# Patient Record
Sex: Male | Born: 1940
Health system: Southern US, Community
[De-identification: ages and names within clinical notes are randomized; demographics above are authoritative.]

## PROBLEM LIST (undated history)

## (undated) DIAGNOSIS — J439 Emphysema, unspecified: Secondary | ICD-10-CM

## (undated) DIAGNOSIS — K219 Gastro-esophageal reflux disease without esophagitis: Secondary | ICD-10-CM

## (undated) DIAGNOSIS — E785 Hyperlipidemia, unspecified: Secondary | ICD-10-CM

## (undated) DIAGNOSIS — J449 Chronic obstructive pulmonary disease, unspecified: Secondary | ICD-10-CM

## (undated) DIAGNOSIS — I10 Essential (primary) hypertension: Secondary | ICD-10-CM

## (undated) DIAGNOSIS — Z860100 Personal history of colon polyps, unspecified: Secondary | ICD-10-CM

## (undated) DIAGNOSIS — K5790 Diverticulosis of intestine, part unspecified, without perforation or abscess without bleeding: Secondary | ICD-10-CM

## (undated) DIAGNOSIS — I2510 Atherosclerotic heart disease of native coronary artery without angina pectoris: Secondary | ICD-10-CM

## (undated) DIAGNOSIS — N189 Chronic kidney disease, unspecified: Secondary | ICD-10-CM

## (undated) DIAGNOSIS — R42 Dizziness and giddiness: Secondary | ICD-10-CM

## (undated) HISTORY — PX: COLONOSCOPY: SHX174

## (undated) HISTORY — DX: Hyperlipidemia, unspecified: E78.5

## (undated) HISTORY — DX: Gastro-esophageal reflux disease without esophagitis: K21.9

## (undated) HISTORY — PX: POLYPECTOMY: SHX149

## (undated) HISTORY — DX: Emphysema, unspecified: J43.9

## (undated) HISTORY — DX: Diverticulosis of intestine, part unspecified, without perforation or abscess without bleeding: K57.90

## (undated) HISTORY — DX: Personal history of colon polyps, unspecified: Z86.0100

## (undated) HISTORY — PX: CATARACT EXTRACTION: SUR2

## (undated) HISTORY — PX: EYE SURGERY: SHX253

## (undated) HISTORY — DX: Dizziness and giddiness: R42

## (undated) HISTORY — DX: Personal history of colonic polyps: Z86.010

## (undated) HISTORY — DX: Essential (primary) hypertension: I10

---

## 2008-11-19 ENCOUNTER — Emergency Department (HOSPITAL_COMMUNITY): Admission: EM | Admit: 2008-11-19 | Discharge: 2008-11-19 | Payer: Self-pay | Admitting: Emergency Medicine

## 2009-03-29 ENCOUNTER — Encounter (HOSPITAL_BASED_OUTPATIENT_CLINIC_OR_DEPARTMENT_OTHER): Admission: RE | Admit: 2009-03-29 | Discharge: 2009-06-24 | Payer: Self-pay | Admitting: General Surgery

## 2009-04-01 ENCOUNTER — Ambulatory Visit: Payer: Self-pay | Admitting: Vascular Surgery

## 2009-04-01 ENCOUNTER — Encounter (HOSPITAL_BASED_OUTPATIENT_CLINIC_OR_DEPARTMENT_OTHER): Payer: Self-pay | Admitting: General Surgery

## 2009-04-01 ENCOUNTER — Ambulatory Visit: Admission: RE | Admit: 2009-04-01 | Discharge: 2009-04-01 | Payer: Self-pay | Admitting: General Surgery

## 2009-07-27 ENCOUNTER — Encounter (INDEPENDENT_AMBULATORY_CARE_PROVIDER_SITE_OTHER): Payer: Self-pay | Admitting: *Deleted

## 2009-08-18 ENCOUNTER — Encounter (INDEPENDENT_AMBULATORY_CARE_PROVIDER_SITE_OTHER): Payer: Self-pay | Admitting: *Deleted

## 2009-08-19 ENCOUNTER — Ambulatory Visit: Payer: Self-pay | Admitting: Internal Medicine

## 2009-09-02 ENCOUNTER — Ambulatory Visit: Payer: Self-pay | Admitting: Internal Medicine

## 2010-07-13 ENCOUNTER — Encounter: Payer: Self-pay | Admitting: Cardiology

## 2010-08-02 NOTE — Procedures (Signed)
Summary: Colonoscopy  Patient: [name] Note: All result statuses are Final unless otherwise noted.  Tests: (1) Colonoscopy (COL)   COL Colonoscopy           DONE     [facility]     [address]     [place], [place]  [address]           COLONOSCOPY PROCEDURE REPORT           PATIENT:  Michael, [name]  MR#:  [id number]     BIRTHDATE:  1941/03/12, 68 yrs. old  GENDER:  male           ENDOSCOPIST:  Wilhemina Bonito. Eda Keys, MD     Referred by:  Jarome Matin, M.D.           PROCEDURE DATE:  09/02/2009     PROCEDURE:  Colonoscopy with snare polypectomy x 1     ASA CLASS:  Class II     INDICATIONS:  history of pre-cancerous (adenomatous) colon polyps     (10-2005 in [place] w/ TV adenoma and adenoma)           MEDICATIONS:   Fentanyl 75 mcg IV, Versed 9 mg IV, Benadryl 25 mg     IV           DESCRIPTION OF PROCEDURE:   After the risks benefits and     alternatives of the procedure were thoroughly explained, informed     consent was obtained.  Digital rectal exam was performed and     revealed no abnormalities.   The LB CF-[id number] endoscope     was introduced through the anus and advanced to the cecum, which     was identified by both the appendix and ileocecal valve, without     limitations.Time to cecum = 1:18 min. The quality of the prep was     excellent, using MoviPrep.  The instrument was then slowly     withdrawn (time = 11:30 min) as the colon was fully examined.     <<PROCEDUREIMAGES>>           FINDINGS:  A 1mm diminutive polyp was found in the descending     colon. Polyp was snared without cautery. Retrieval was     unsuccessful as the tissue was too small for submission.  Mild     diverticulosis was found in the sigmoid colon.  This was otherwise     a normal examination of the colon.   Retroflexed views in the     rectum revealed no abnormalities.    The scope was then withdrawn     from the patient and the procedure completed.           COMPLICATIONS:   None           ENDOSCOPIC IMPRESSION:     1) Diminutive polyp in the descending colon - removed     2) Mild diverticulosis in the sigmoid colon     3) Otherwise normal examination           RECOMMENDATIONS:     1) Follow up colonoscopy in 5 years           ______________________________     Wilhemina Bonito. Eda Keys, MD           CC:  Jarome Matin, MD; The Patient           n.     eSIGNED:   Wilhemina Bonito. Marina Goodell  Jr at 09/02/2009 09:21 AM           Roseanne Reno, [id number]  Note: An exclamation mark (!) indicates a result that was not dispersed into the flowsheet. Document Creation Date: 09/02/2009 9:21 AM _______________________________________________________________________  (1) Order result status: Final Collection or observation date-time: 09/02/2009 09:15 Requested date-time:  Receipt date-time:  Reported date-time:  Referring Physician:   Ordering Physician: Fransico Setters [phone]) Specimen Source:  Source: Launa Grill Order Number: [id number] Lab site:   Appended Document: Colonoscopy recall     Procedures Next Due Date:    Colonoscopy: 09/2014

## 2010-08-02 NOTE — Letter (Signed)
Summary: [facility] Instructions  [facility]  [address] [place], [place] [address]   Phone: [phone]  Fax: [phone]       Michael [name]    [birth year]    MRN: [id number]        Procedure Day Michael [name]:  Michael [name]  09/02/09     Arrival Time:  7:30AM     Procedure Time:  8:30AM     Location of Procedure:                    Michael [name] _  [facility] (4th Floor)                        PREPARATION FOR COLONOSCOPY WITH MOVIPREP   Starting 5 days prior to your procedure 2/26/11do not eat nuts, seeds, popcorn, corn, beans, peas,  salads, or any raw vegetables.  Do not take any fiber supplements (e.g. Metamucil, Citrucel, and Benefiber).  THE DAY BEFORE YOUR PROCEDURE         DATE: 09/01/09  DAY: WEDNESDAY  1.  Drink clear liquids the entire day-NO SOLID FOOD  2.  Do not drink anything colored red or purple.  Avoid juices with pulp.  No orange juice.  3.  Drink at least 64 oz. (8 glasses) of fluid/clear liquids during the day to prevent dehydration and help the prep work efficiently.  CLEAR LIQUIDS INCLUDE: Water Jello Ice Popsicles Tea (sugar ok, no milk/cream) Powdered fruit flavored drinks Coffee (sugar ok, no milk/cream) Gatorade Juice: apple, white grape, white cranberry  Lemonade Clear bullion, consomm, broth Carbonated beverages (any kind) Strained chicken noodle soup Hard Candy                             4.  In the morning, mix first dose of MoviPrep solution:    Empty 1 Pouch A and 1 Pouch B into the disposable container    Add lukewarm drinking water to the top line of the container. Mix to dissolve    Refrigerate (mixed solution should be used within 24 hrs)  5.  Begin drinking the prep at 5:00 p.m. The MoviPrep container is divided by 4 marks.   Every 15 minutes drink the solution down to the next mark (approximately 8 oz) until the full liter is complete.   6.  Follow completed prep with 16 oz of clear liquid of your choice  (Nothing red or purple).  Continue to drink clear liquids until bedtime.  7.  Before going to bed, mix second dose of MoviPrep solution:    Empty 1 Pouch A and 1 Pouch B into the disposable container    Add lukewarm drinking water to the top line of the container. Mix to dissolve    Refrigerate  THE DAY OF YOUR PROCEDURE      DATE: 09/02/09  DAY: THURSDAY  Beginning at 3:30AM (5 hours before procedure):         1. Every 15 minutes, drink the solution down to the next mark (approx 8 oz) until the full liter is complete.  2. Follow completed prep with 16 oz. of clear liquid of your choice.    3. You may drink clear liquids until 6:30AM (2 HOURS BEFORE PROCEDURE).   MEDICATION INSTRUCTIONS  Unless otherwise instructed, you should take regular prescription medications with a small sip of water   as early as possible the morning of  your procedure.        OTHER INSTRUCTIONS  You will need a responsible adult at least 68 years of age to accompany you and drive you home.   This person must remain in the waiting room during your procedure.  Wear loose fitting clothing that is easily removed.  Leave jewelry and other valuables at home.  However, you may wish to bring a book to read or  an iPod/MP3 player to listen to music as you wait for your procedure to start.  Remove all body piercing jewelry and leave at home.  Total time from sign-in until discharge is approximately 2-3 hours.  You should go home directly after your procedure and rest.  You can resume normal activities the  day after your procedure.  The day of your procedure you should not:   Drive   Make legal decisions   Operate machinery   Drink alcohol   Return to work  You will receive specific instructions about eating, activities and medications before you leave.    The above instructions have been reviewed and explained to me by   Michael Almas RN  August 19, 2009 8:48 AM     I fully  understand and can verbalize these instructions _____________________________ Date _________

## 2010-08-02 NOTE — Letter (Signed)
Summary: Previsit letter  [facility]  [address] [place], [place] [address]   Phone: [phone]  Fax: [phone]       07/27/2009 MRN: [id number]  Michael [name] [address] [place], [place]  [address]  Dear Mr. Schneeberger,  Welcome to the Gastroenterology Division at [facility].    You are scheduled to see a nurse for your pre-procedure visit on 08-19-09 at 8:30a.m. on the 3rd floor at [facility], [address]  We ask that you try to arrive at our office 15 minutes prior to your appointment time to allow for check-in.  Your nurse visit will consist of discussing your medical and surgical history, your immediate family medical history, and your medications.    Please bring a complete list of all your medications or, if you prefer, bring the medication bottles and we will list them.  We will need to be aware of both prescribed and over the counter drugs.  We will need to know exact dosage information as well.  If you are on blood thinners (Coumadin, Plavix, Aggrenox, Ticlid, etc.) please call our office today/prior to your appointment, as we need to consult with your physician about holding your medication.   Please be prepared to read and sign documents such as consent forms, a financial agreement, and acknowledgement forms.  If necessary, and with your consent, a friend or relative is welcome to sit-in on the nurse visit with you.  Please bring your insurance card so that we may make a copy of it.  If your insurance requires a referral to see a specialist, please bring your referral form from your primary care physician.  No co-pay is required for this nurse visit.     If you cannot keep your appointment, please call [phone] to cancel or reschedule prior to your appointment date.  This allows [place] the opportunity to schedule an appointment for another patient in need of care.    Thank you for choosing [facility] for your medical  needs.  We appreciate the opportunity to care for you.  Please visit [place] at our website  to learn more about our practice.                     Sincerely.                                                                                                                   The Gastroenterology Division

## 2010-08-02 NOTE — Miscellaneous (Signed)
Summary: [facility] Previsit/prep  Clinical Lists Changes  Medications: Added new medication of MOVIPREP 100 GM  SOLR (PEG-KCL-NACL-NASULF-NA ASC-C) As per prep instructions. - Signed Rx of MOVIPREP 100 GM  SOLR (PEG-KCL-NACL-NASULF-NA ASC-C) As per prep instructions.;  #1 x 0;  Signed;  Entered by: Wyona Almas RN;  Authorized by: Hilarie Fredrickson MD;  Method used: Electronically to [id number]*, [address] [place], [place], [place]  [address], Ph: [phone], Fax: [phone] Observations: Added new observation of NKA: T (08/19/2009 8:17)    Prescriptions: MOVIPREP 100 GM  SOLR (PEG-KCL-NACL-NASULF-NA ASC-C) As per prep instructions.  #1 x 0   Entered by:   Wyona Almas RN   Authorized by:   Hilarie Fredrickson MD   Signed by:   Wyona Almas RN on 08/19/2009   Method used:   Electronically to        [facility] [phone]* (retail)       [address] [place], [place]  [address]       Ph: [phone]       Fax: [phone]   RxID:   [id number]

## 2010-08-25 ENCOUNTER — Ambulatory Visit (INDEPENDENT_AMBULATORY_CARE_PROVIDER_SITE_OTHER): Payer: Medicare Other | Admitting: Cardiology

## 2010-08-25 ENCOUNTER — Encounter: Payer: Self-pay | Admitting: Cardiology

## 2010-08-25 DIAGNOSIS — I10 Essential (primary) hypertension: Secondary | ICD-10-CM | POA: Insufficient documentation

## 2010-08-25 DIAGNOSIS — E785 Hyperlipidemia, unspecified: Secondary | ICD-10-CM | POA: Insufficient documentation

## 2010-08-25 DIAGNOSIS — R42 Dizziness and giddiness: Secondary | ICD-10-CM | POA: Insufficient documentation

## 2010-08-25 DIAGNOSIS — R079 Chest pain, unspecified: Secondary | ICD-10-CM | POA: Insufficient documentation

## 2010-08-30 NOTE — Assessment & Plan Note (Signed)
Summary: NP6/DIZZINESS/PER Malachi Bonds [phone] PT HAVE  MEDICARE/BCBS-MB/A...   Primary Provider:  Dr. Eloise Harman   History of Present Illness: 70 yo with history of HTN, hyperlipidemia, and spells of lightheadedness presents for evaluation of recurrent lightheadedness.  Patient was evaluated for lightheadedness about 1.5 years ago by Dr. Donnie Aho.  He wore a 3-week event monitor and had an echo done, both were unremarkable.  Of note, his lightheaded symptoms had resolved by the time he had the monitor done.  He had no dizziness for a year or so, then 2-3 months ago, he developed the episodes again.  The episodes always occurred while standing up and lasted for a few minutes at a time.  No tachypalpitations with episodes.  No particular trigger.  He occasionally feels his heart skip a beat at night but this is not associated with dizziness.  He had about 5 episodes of lightheadedness but has not had any further episodes for over a month now.   Patient also reports episodes of shortness of breath and mild chest tightness with exertion.  He tends to note these episodes if he eats a meal before going on a walk (will get the chest tightness while walking) or when he tries to lift and carry a heavy object.  These symptoms are not frequent.  They always resolve with rest.  BP is elevated today in the office to 159/95.  He thinks that it runs lower at home.   Patient was not orthostatic in the office today.   Labs (11/11): LDL 144, HDL 68, creatinine 1.2  Current Medications (verified): 1)  Vitamin D (Ergocalciferol) 50000 Unit Caps (Ergocalciferol) .... Weekly 2)  Amlodipine Besylate 5 Mg Tabs (Amlodipine Besylate) .Marland Kitchen.. 1 By Mouth Daily 3)  Lisinopril 10 Mg Tabs (Lisinopril) .Marland Kitchen.. 1 By Mouth Daily 4)  Alprazolam 0.25 Mg Tabs (Alprazolam) .... As Needed 5)  Citracal Plus Bone Density  Tabs (Multiple Minerals-Vitamins) .Marland Kitchen.. 1 By Mouth Daily 6)  Aspirin 81 Mg  Tabs (Aspirin) .Marland Kitchen.. 1 By Mouth Daily 7)  Fish Oil    Oil (Fish Oil) .Marland Kitchen.. 1 By Mouth Daily 8)  Multivitamins   Tabs (Multiple Vitamin) .Marland Kitchen.. 1 By Mouth Daily 9)  Ipratropium Bromide 0.02 % Soln (Ipratropium Bromide) .... Uad  Allergies (verified): No Known Drug Allergies  Past History:  Past Medical History: 1. HTN 2. History of lightheadedness.  Workup for this about 1.5 years ago (Dr. Donnie Aho) included a 3 week event monitor and an echo, which were both unremarkable.  Of note, patient was no longer having lightheaded spells when he wore the monitor.  3.  Hyperlipidemia 4.  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  He does help out there fairly frequently.  Smoked about 1 ppd but quit in 1984.  Married.  Serves as Personal assistant.   Review of Systems       All systems reviewed and negative except as per HPI.   Vital Signs:  Patient profile:   70 year old male Height:      70 inches Weight:      198 pounds BMI:     28.51 Pulse rate:   63 / minute Pulse (ortho):   65 / minute Resp:     16 per minute BP sitting:   122 / 92  (right arm) BP standing:   159 / 95  Vitals Entered By: Marrion Coy, CNA (August 25, 2010 8:05 AM)  Serial Vital Signs/Assessments:  Time      Position  BP       Pulse  Resp  Temp     By           Lying RA  155/89   62                    Marrion Coy, CNA           Sitting   152/92   64                    Marrion Coy, CNA           Standing  159/95   65                    [address], CNA 2 Min     Standing  155/90   61                    [address] CNA 5 Min     Standing  153/97   62                    [place], CNA  Comments: Lying- No complaints Sitting- No complaints Standing- NO complaints By: Marrion Coy, CNA  2 Min 2 Min standing- No complaints By: Marrion Coy, CNA  5 Min 5 Min standing- no complaints By: Marrion Coy, CNA    Physical Exam  General:  Well developed,  well nourished, in no acute distress. Overweight.  Head:  normocephalic and atraumatic Nose:  no deformity, discharge, inflammation, or lesions Mouth:  Teeth, gums and palate normal. Oral mucosa normal. Neck:  Neck supple, no JVD. No masses, thyromegaly or abnormal cervical nodes. Lungs:  Clear bilaterally to auscultation and percussion. Heart:  Non-displaced PMI, chest non-tender; regular rate and rhythm, S1, S2 without murmurs, rubs or gallops. Carotid upstroke normal, no bruit.  Pedals normal pulses. No edema, no varicosities. Abdomen:  Bowel sounds positive; abdomen soft and non-tender without masses, organomegaly, or hernias noted. No hepatosplenomegaly. Extremities:  No clubbing or cyanosis. Neurologic:  Alert and oriented x 3. Skin:  Intact without lesions or rashes. Psych:  Normal affect.   Impression & Recommendations:  Problem # 1:  DIZZINESS (ICD-780.4) Patient has history of lightheaded spells, always while standing but with no other trigger.  He had an event monitor for the same symptoms 1.5 years ago that was unremarkable (but he did not have symptoms while the monitor was on).  He has not had any lightheaded spells now for almost 2 months.  As they seem to cluster in time, I will have him call us if the symptoms recur and I will have him put on a 3-week monitor at that time.  I am not going to put one on him now as I would like him to be having actual episodes when we monitor him. He had an echo 1.5 years ago that was unremarkable.   Problem # 2:  CHEST PAIN-UNSPECIFIED (ICD-786.50) Patient has some mild exertional chest pain and dyspnea.  He has a number of cardiac risk factors including HTN and hyperlipidemia.  I am going to set him up for an ETT-myoview for risk stratification.  He should continue ASA 81 mg daily.   Problem # 3:  HYPERTENSION, BENIGN (ICD-401.1) BP is high today.  Patient thinks it has been better at home.  I  will have him monitor BP daily for 2 weeks at  home.  My nurse will call him at that time, and if BP is running high on home reads, will increase lisinopril or amlodipine.   Problem # 4:  HYPERLIPIDEMIA-MIXED (ICD-272.4) LDL is high.  I am going to have the patient start pravastatin 20 mg daily given his risk profile.  Lipids/LFTs in 2 months.   Other Orders: Nuclear Stress Test (Nuc Stress Test)  Patient Instructions: 1)  Your physician has recommended you make the following change in your medication:  2)  Start Pravachol 20mg  daily in the evening. 3)  Your physician recommends that you return for a FASTING lipid profile/liver profile in 2 months 4)  Take and record your blood pressure--I will call you in 2 weeks to get the readings. Luana Shu  5)  Your physician has requested that you have an exercise stress myoview.  For further information please visit [email or web address]  Please follow instruction sheet, as given. 6)  Call our office if you have more dizziness/lightheadedness. Dr Shirlee Latch will order a 3 week  event monitor. 7)  Your physician wants you to follow-up in: 6 months with Dr Shirlee Latch.((AUGUST 2012)  You will receive a reminder letter in the mail two months in advance. If you don't receive a letter, please call our office to schedule the follow-up appointment. Prescriptions: PRAVACHOL 20 MG TABS (PRAVASTATIN SODIUM) one daily in the evening  #90 x 3   Entered by:   Katina Dung, RN, BSN   Authorized by:   Marca Ancona, MD   Signed by:   Katina Dung, RN, BSN on 08/25/2010   Method used:   Faxed to ...       [facility] (mail-order)             , [place]         Ph: [phone]       Fax: [phone]   RxID:   [id number]

## 2010-09-06 ENCOUNTER — Telehealth (INDEPENDENT_AMBULATORY_CARE_PROVIDER_SITE_OTHER): Payer: Self-pay | Admitting: *Deleted

## 2010-09-07 ENCOUNTER — Encounter: Payer: Self-pay | Admitting: Cardiology

## 2010-09-07 ENCOUNTER — Ambulatory Visit (HOSPITAL_COMMUNITY): Payer: Medicare Other | Attending: Cardiology

## 2010-09-07 DIAGNOSIS — R0609 Other forms of dyspnea: Secondary | ICD-10-CM

## 2010-09-07 DIAGNOSIS — R079 Chest pain, unspecified: Secondary | ICD-10-CM | POA: Insufficient documentation

## 2010-09-07 DIAGNOSIS — I491 Atrial premature depolarization: Secondary | ICD-10-CM

## 2010-09-07 DIAGNOSIS — R0789 Other chest pain: Secondary | ICD-10-CM

## 2010-09-08 ENCOUNTER — Telehealth: Payer: Self-pay | Admitting: Cardiology

## 2010-09-08 ENCOUNTER — Encounter: Payer: Self-pay | Admitting: Cardiology

## 2010-09-13 NOTE — Progress Notes (Signed)
Summary: Nuclear Pre-Procedure  Phone Note Outgoing Call Call back at [facility] Phone [phone]   Call placed by: Stanton Kidney, EMT-P,  September 06, 2010 11:33 AM Action Taken: Phone Call Completed Summary of Call: Reviewed information on Myoview Information Sheet (see scanned document for further details).  Spoke with the patient's wife. Stanton Kidney, EMT-P  September 06, 2010 11:34 AM      Nuclear Med Background Indications for Stress Test: Evaluation for Ischemia   History: Echo  History Comments:  ~2010 Echo: NL  Symptoms: Chest Pain, Chest Tightness with Exertion, Dizziness, Light-Headedness    Nuclear Pre-Procedure Cardiac Risk Factors: History of Smoking, Hypertension, Lipids Height (in): 70

## 2010-09-13 NOTE — Letter (Signed)
Summary: [facility] Office Visit Note   [facility] Office Visit Note   Imported By: Roderic Ovens 09/06/2010 15:27:58  _____________________________________________________________________  External Attachment:    Type:   Image     Comment:   External Document

## 2010-09-13 NOTE — Assessment & Plan Note (Signed)
Summary: Cardiology Nuclear Testing  Nuclear Med Background Indications for Stress Test: Evaluation for Ischemia   History: Echo, GXT  History Comments:  ~20 yrs ago GXT:OK per patient ~'10 Echo:normal  Symptoms: Chest Pain, Chest Pain with Exertion, Chest Tightness, Chest Tightness with Exertion, Dizziness, DOE, Light-Headedness, Palpitations  Symptoms Comments: Last episode of CP:one month.   Nuclear Pre-Procedure Cardiac Risk Factors: History of Smoking, Hypertension, Lipids Caffeine/Decaff Intake: None NPO After: 7:00 PM Lungs: Clear IV 0.9% NS with Angio Cath: 22g     IV Site: R Forearm IV Started by: Irean Hong, RN Chest Size (in) 44     Height (in): 70 Weight (lb): 192 BMI: 27.65  Nuclear Med Study 1 or 2 day study:  1 day     Stress Test Type:  Stress Reading MD:  Willa Rough, MD     Referring MD:  Marca Ancona, MD Resting Radionuclide:  Technetium 77m Tetrofosmin     Resting Radionuclide Dose:  11 mCi  Stress Radionuclide:  Technetium 66m Tetrofosmin     Stress Radionuclide Dose:  33 mCi   Stress Protocol Exercise Time (min):  9:30 min     Max HR:  146 bpm     Predicted Max HR:  153 bpm  Max Systolic BP: 198 mm Hg     Percent Max HR:  95.42 %     METS: 10.9 Rate Pressure Product:  91478    Stress Test Technologist:  Rea College, CMA-N     Nuclear Technologist:  Domenic Polite, CNMT  Rest Procedure  Myocardial perfusion imaging was performed at rest 45 minutes following the intravenous administration of Technetium 21m Tetrofosmin.  Stress Procedure  The patient exercised for 9:30 on the treadmill utilizing the Bruce protocol.  The patient stopped due to fatigue and denied any chest pain.  There were no diagnostic ST-T wave changes, only occasional PAC's.  Technetium 81m Tetrofosmin was injected at peak exercise and myocardial perfusion imaging was performed after a brief delay.  QPS Raw Data Images:  Normal; no motion artifact; normal heart/lung  ratio. Stress Images:  Normal homogeneous uptake in all areas of the myocardium. Rest Images:  Normal homogeneous uptake in all areas of the myocardium. Subtraction (SDS):  No evidence of ischemia. Transient Ischemic Dilatation:  .85  (Normal <1.22)  Lung/Heart Ratio:  .34  (Normal <0.45)  Quantitative Gated Spect Images QGS EDV:  75 ml QGS ESV:  27 ml QGS EF:  64 % QGS cine images:  Normal motion  Findings Normal nuclear study      Overall Impression  Exercise Capacity: Good exercise capacity. BP Response: Normal blood pressure response. Clinical Symptoms: SOB ECG Impression: No significant ST segment change suggestive of ischemia. Overall Impression: Normal stress nuclear study.  Appended Document: Cardiology Nuclear Testing good exercise capacity and normal stress test.   Appended Document: Cardiology Nuclear Testing discussed results with pt

## 2010-09-20 NOTE — Progress Notes (Signed)
Summary: BP readings--fax from pt  Phone Note Outgoing Call   Call placed by: Katina Dung, RN, BSN,  September 08, 2010 7:22 AM Call placed to: Patient Summary of Call: BP readings  Follow-up for Phone Call        pt to fax BP readings to me--I talked with pt --he will take one more reading tonight then put readings on spread sheet and fax to me tomorrow--he states he thinks BPs have been averaging in the 140/80 range--fax with BP readings 08/26/10-09/10/10  received from pt--will forward to Dr Shirlee Latch for review      Appended Document: BP readings--fax from pt received fax from pt with BP readings--Dr Shirlee Latch recommended pt increase Amlodipine to 7.5mg  daily --LMTCB   Appended Document: BP readings--fax from pt I discussed with pt--he will increase Amlodipine to 7.5mg  daily--he will monitor his BP for 2 weeks and fax results to Dr Shirlee Latch   Clinical Lists Changes  Medications: Changed medication from AMLODIPINE BESYLATE 5 MG TABS (AMLODIPINE BESYLATE) 1 by mouth daily to AMLODIPINE BESYLATE 5 MG TABS (AMLODIPINE BESYLATE) one and one-half daily Observations: Added new observation of MEDRECON: current updated (09/15/2010 17:51)       Current Medications (verified): 1)  Vitamin D (Ergocalciferol) 50000 Unit Caps (Ergocalciferol) .... Weekly 2)  Amlodipine Besylate 5 Mg Tabs (Amlodipine Besylate) .... One and One-Half Daily 3)  Lisinopril 10 Mg Tabs (Lisinopril) .Marland Kitchen.. 1 By Mouth Daily 4)  Alprazolam 0.25 Mg Tabs (Alprazolam) .... As Needed 5)  Citracal Plus Bone Density  Tabs (Multiple Minerals-Vitamins) .Marland Kitchen.. 1 By Mouth Daily 6)  Aspirin 81 Mg  Tabs (Aspirin) .Marland Kitchen.. 1 By Mouth Daily 7)  Fish Oil   Oil (Fish Oil) .Marland Kitchen.. 1 By Mouth Daily 8)  Multivitamins   Tabs (Multiple Vitamin) .Marland Kitchen.. 1 By Mouth Daily 9)  Ipratropium Bromide 0.02 % Soln (Ipratropium Bromide) .... Uad 10)  Pravachol 20 Mg Tabs (Pravastatin Sodium) .... One Daily in The Evening  Allergies: No Known Drug Allergies

## 2010-10-06 ENCOUNTER — Telehealth: Payer: Self-pay | Admitting: Cardiology

## 2010-10-06 NOTE — Telephone Encounter (Signed)
Pt needs refill of amlopidine at [facility] taking 1 and 1/2 tabs, said it was recently increased

## 2010-10-07 ENCOUNTER — Other Ambulatory Visit: Payer: Self-pay | Admitting: *Deleted

## 2010-10-07 MED ORDER — AMLODIPINE BESYLATE 5 MG PO TABS: 7.5000 mg | ORAL_TABLET | Freq: Every day | ORAL | Status: DC

## 2010-10-11 LAB — COMPREHENSIVE METABOLIC PANEL
ALT: 19 U/L (ref 0–53)
AST: 25 U/L (ref 0–37)
Albumin: 3.8 g/dL (ref 3.5–5.2)
Alkaline Phosphatase: 57 U/L (ref 39–117)
BUN: 20 mg/dL (ref 6–23)
Chloride: 107 mEq/L (ref 96–112)
Potassium: 4.3 mEq/L (ref 3.5–5.1)
Sodium: 138 mEq/L (ref 135–145)
Total Bilirubin: 0.7 mg/dL (ref 0.3–1.2)
Total Protein: 6.7 g/dL (ref 6.0–8.3)

## 2010-10-11 LAB — CK TOTAL AND CKMB (NOT AT ARMC)
CK, MB: 2 ng/mL (ref 0.3–4.0)
Total CK: 87 U/L (ref 7–232)

## 2010-10-11 LAB — CBC
Hemoglobin: 16.1 g/dL (ref 13.0–17.0)
MCHC: 34 g/dL (ref 30.0–36.0)
MCV: 93.8 fL (ref 78.0–100.0)
RBC: 5.05 MIL/uL (ref 4.22–5.81)
WBC: 9.3 10*3/uL (ref 4.0–10.5)

## 2010-10-11 LAB — DIFFERENTIAL
Basophils Relative: 1 % (ref 0–1)
Lymphs Abs: 1.7 10*3/uL (ref 0.7–4.0)
Monocytes Absolute: 0.8 10*3/uL (ref 0.1–1.0)
Monocytes Relative: 9 % (ref 3–12)
Neutro Abs: 6.5 10*3/uL (ref 1.7–7.7)

## 2010-10-24 ENCOUNTER — Other Ambulatory Visit (INDEPENDENT_AMBULATORY_CARE_PROVIDER_SITE_OTHER): Payer: Medicare Other | Admitting: *Deleted

## 2010-10-24 ENCOUNTER — Telehealth: Payer: Self-pay | Admitting: Cardiology

## 2010-10-24 DIAGNOSIS — E7800 Pure hypercholesterolemia, unspecified: Secondary | ICD-10-CM

## 2010-10-24 LAB — LIPID PANEL
Cholesterol: 186 mg/dL (ref 0–200)
LDL Cholesterol: 106 mg/dL — ABNORMAL HIGH (ref 0–99)
Total CHOL/HDL Ratio: 3
VLDL: 11.8 mg/dL (ref 0.0–40.0)

## 2010-10-24 LAB — HEPATIC FUNCTION PANEL
Alkaline Phosphatase: 50 U/L (ref 39–117)
Bilirubin, Direct: 0 mg/dL (ref 0.0–0.3)
Total Bilirubin: 0.8 mg/dL (ref 0.3–1.2)

## 2010-10-24 NOTE — Telephone Encounter (Signed)
Walk In Pt Form " Medicine Update" sent to Message Nurse 10/24/10/KM

## 2010-10-28 ENCOUNTER — Telehealth: Payer: Self-pay | Admitting: *Deleted

## 2010-10-28 NOTE — Telephone Encounter (Signed)
Notes Recorded by Marca Ancona, MD on 10/24/2010 at 6:25 PM LDL improved on pravastatin. Continue current dose. Work on diet/exercise.  Discussed lab results and these recommendations with pt.

## 2011-02-08 ENCOUNTER — Encounter: Payer: Self-pay | Admitting: Cardiology

## 2011-03-09 ENCOUNTER — Encounter: Payer: Self-pay | Admitting: Cardiology

## 2011-03-09 ENCOUNTER — Ambulatory Visit (INDEPENDENT_AMBULATORY_CARE_PROVIDER_SITE_OTHER): Payer: Medicare Other | Admitting: Cardiology

## 2011-03-09 DIAGNOSIS — E785 Hyperlipidemia, unspecified: Secondary | ICD-10-CM

## 2011-03-09 DIAGNOSIS — R42 Dizziness and giddiness: Secondary | ICD-10-CM

## 2011-03-09 DIAGNOSIS — I10 Essential (primary) hypertension: Secondary | ICD-10-CM

## 2011-03-09 DIAGNOSIS — R079 Chest pain, unspecified: Secondary | ICD-10-CM

## 2011-03-09 NOTE — Patient Instructions (Signed)
Your physician wants you to follow-up in: 1 year with Dr McLean.(September  2013). You will receive a reminder letter in the mail two months in advance. If you don't receive a letter, please call our office to schedule the follow-up appointment.  

## 2011-03-10 NOTE — Assessment & Plan Note (Signed)
Last LDL was acceptable based on the patient's risk profile.

## 2011-03-10 NOTE — Assessment & Plan Note (Signed)
No recent lightheaded spells.  If they begin to recur, would set him up for a 3 week event monitor.

## 2011-03-10 NOTE — Assessment & Plan Note (Signed)
No evidence for ischemia or infarction on recent myoview.

## 2011-03-10 NOTE — Assessment & Plan Note (Signed)
BP is under reasonably good control.

## 2011-03-10 NOTE — Progress Notes (Signed)
PCP: Dr. Eloise Harman  70 yo with history of HTN, hyperlipidemia, and spells of lightheadedness presents for cardiology followup.  After last appointment, he had an ETT-myoview for atypical chest pain that showed no evidence for ischemia or infarction.  Since I last saw him, he has had only 1 episode of mild lightheadedness about 2 months ago.  He continues to be active at work.  No exertional dyspnea or chest pain.  Weight is down 2 lbs since last appointment. Systolic blood pressure has run in the 130s when he checks at home.   Labs (11/11): LDL 144, HDL 68, creatinine 1.2 Labs (4/12): LDL 106, HDL 68  Allergies (verified):  No Known Drug Allergies  Past Medical History: 1. HTN 2. History of lightheadedness.  Workup for this about 1.5 years ago (Dr. Donnie Aho) included a 3 week event monitor and an echo, which were both unremarkable.  Of note, patient was no longer having lightheaded spells when he wore the monitor.  3.  Hyperlipidemia 4.  Colonic polyps 5.  Chest pain: ETT-myoview (3/12) with 9'30" exercise, no ischemic ECG changes, no evidence for ischemia or infarction by perfusion images, EF 64%.  6.  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  He does help out there fairly frequently.  Smoked about 1 ppd but quit in 1984.  Married.  Serves as Personal assistant.   Current Outpatient Prescriptions  Medication Sig Dispense Refill  . ALPRAZolam (XANAX) 0.25 MG tablet Take 0.25 mg by mouth as needed.        Marland Kitchen amLODipine (NORVASC) 5 MG tablet Take 1.5 tablets (7.5 mg total) by mouth daily.  180 tablet  3  . aspirin 81 MG tablet Take 81 mg by mouth daily.        . ergocalciferol (VITAMIN D2) 50000 UNITS capsule Take 50,000 Units by mouth once a week.        . Fish Oil OIL by Does not apply route daily. 1000mg  daily      . ipratropium (ATROVENT) 0.02 % nebulizer solution Take 500 mcg by nebulization as  directed.        Marland Kitchen lisinopril (PRINIVIL,ZESTRIL) 10 MG tablet Take 10 mg by mouth daily.        . Multiple Minerals-Vitamins (CITRACAL PLUS BONE DENSITY PO) Take 1 tablet by mouth daily.        . multivitamin (THERAGRAN) per tablet Take 1 tablet by mouth daily.        . pravastatin (PRAVACHOL) 20 MG tablet Take 20 mg by mouth daily.          BP 136/72  Pulse 62  Ht 5\' 10"  (1.778 m)  Wt 196 lb 8 oz (89.132 kg)  BMI 28.19 kg/m2 General:  Well developed, well nourished, in no acute distress. Overweight.  Neck:  Neck supple, no JVD. No masses, thyromegaly or abnormal cervical nodes. Lungs:  Clear bilaterally to auscultation and percussion. Heart:  Non-displaced PMI, chest non-tender; regular rate and rhythm, S1, S2 without murmurs, rubs or gallops. Carotid upstroke normal, no bruit.  Pedals normal pulses. No edema, no varicosities. Abdomen:  Bowel sounds positive; abdomen soft and non-tender without masses, organomegaly, or hernias noted. No hepatosplenomegaly. Extremities:  No clubbing or cyanosis. Neurologic:  Alert and oriented x 3. Psych:  Normal affect.

## 2011-05-18 ENCOUNTER — Other Ambulatory Visit (HOSPITAL_COMMUNITY): Payer: Self-pay | Admitting: Internal Medicine

## 2011-05-18 DIAGNOSIS — R0609 Other forms of dyspnea: Secondary | ICD-10-CM

## 2011-05-31 ENCOUNTER — Ambulatory Visit (HOSPITAL_COMMUNITY)
Admission: RE | Admit: 2011-05-31 | Discharge: 2011-05-31 | Disposition: A | Discharge status: Home / Self Care | Payer: Medicare Other | Source: Ambulatory Visit | Attending: Internal Medicine | Admitting: Internal Medicine

## 2011-05-31 DIAGNOSIS — R0609 Other forms of dyspnea: Secondary | ICD-10-CM | POA: Insufficient documentation

## 2011-05-31 DIAGNOSIS — R0989 Other specified symptoms and signs involving the circulatory and respiratory systems: Secondary | ICD-10-CM | POA: Insufficient documentation

## 2011-05-31 MED ORDER — ALBUTEROL SULFATE (5 MG/ML) 0.5% IN NEBU
2.5000 mg | INHALATION_SOLUTION | Freq: Once | RESPIRATORY_TRACT | Status: AC
  Administered 2011-05-31: 2.5 mg via RESPIRATORY_TRACT

## 2011-08-25 DIAGNOSIS — M949 Disorder of cartilage, unspecified: Secondary | ICD-10-CM | POA: Diagnosis not present

## 2011-08-25 DIAGNOSIS — J449 Chronic obstructive pulmonary disease, unspecified: Secondary | ICD-10-CM | POA: Diagnosis not present

## 2011-08-25 DIAGNOSIS — R0609 Other forms of dyspnea: Secondary | ICD-10-CM | POA: Diagnosis not present

## 2011-08-25 DIAGNOSIS — R0989 Other specified symptoms and signs involving the circulatory and respiratory systems: Secondary | ICD-10-CM | POA: Diagnosis not present

## 2011-08-25 DIAGNOSIS — M899 Disorder of bone, unspecified: Secondary | ICD-10-CM | POA: Diagnosis not present

## 2011-08-30 DIAGNOSIS — M899 Disorder of bone, unspecified: Secondary | ICD-10-CM | POA: Diagnosis not present

## 2011-11-07 ENCOUNTER — Other Ambulatory Visit (HOSPITAL_COMMUNITY): Payer: Self-pay

## 2011-11-07 MED ORDER — AMLODIPINE BESYLATE 5 MG PO TABS: 7.5000 mg | ORAL_TABLET | Freq: Every day | ORAL | Status: DC

## 2011-11-15 DIAGNOSIS — M7750 Other enthesopathy of unspecified foot: Secondary | ICD-10-CM | POA: Diagnosis not present

## 2011-11-15 DIAGNOSIS — M722 Plantar fascial fibromatosis: Secondary | ICD-10-CM | POA: Diagnosis not present

## 2012-03-31 DIAGNOSIS — Z23 Encounter for immunization: Secondary | ICD-10-CM | POA: Diagnosis not present

## 2012-04-05 DIAGNOSIS — L570 Actinic keratosis: Secondary | ICD-10-CM | POA: Diagnosis not present

## 2012-04-05 DIAGNOSIS — L821 Other seborrheic keratosis: Secondary | ICD-10-CM | POA: Diagnosis not present

## 2012-04-05 DIAGNOSIS — L819 Disorder of pigmentation, unspecified: Secondary | ICD-10-CM | POA: Diagnosis not present

## 2012-04-05 DIAGNOSIS — D2370 Other benign neoplasm of skin of unspecified lower limb, including hip: Secondary | ICD-10-CM | POA: Diagnosis not present

## 2012-05-17 DIAGNOSIS — I10 Essential (primary) hypertension: Secondary | ICD-10-CM | POA: Diagnosis not present

## 2012-05-17 DIAGNOSIS — Z125 Encounter for screening for malignant neoplasm of prostate: Secondary | ICD-10-CM | POA: Diagnosis not present

## 2012-05-17 DIAGNOSIS — E785 Hyperlipidemia, unspecified: Secondary | ICD-10-CM | POA: Diagnosis not present

## 2012-05-17 DIAGNOSIS — M899 Disorder of bone, unspecified: Secondary | ICD-10-CM | POA: Diagnosis not present

## 2012-05-17 DIAGNOSIS — M949 Disorder of cartilage, unspecified: Secondary | ICD-10-CM | POA: Diagnosis not present

## 2012-05-23 DIAGNOSIS — R0609 Other forms of dyspnea: Secondary | ICD-10-CM | POA: Diagnosis not present

## 2012-05-23 DIAGNOSIS — Z125 Encounter for screening for malignant neoplasm of prostate: Secondary | ICD-10-CM | POA: Diagnosis not present

## 2012-05-23 DIAGNOSIS — E785 Hyperlipidemia, unspecified: Secondary | ICD-10-CM | POA: Diagnosis not present

## 2012-05-23 DIAGNOSIS — I10 Essential (primary) hypertension: Secondary | ICD-10-CM | POA: Diagnosis not present

## 2012-05-23 DIAGNOSIS — Z0000 Encounter for general adult medical examination without abnormal findings: Secondary | ICD-10-CM | POA: Diagnosis not present

## 2012-05-24 DIAGNOSIS — H43819 Vitreous degeneration, unspecified eye: Secondary | ICD-10-CM | POA: Diagnosis not present

## 2012-05-24 DIAGNOSIS — H52209 Unspecified astigmatism, unspecified eye: Secondary | ICD-10-CM | POA: Diagnosis not present

## 2012-05-24 DIAGNOSIS — H2510 Age-related nuclear cataract, unspecified eye: Secondary | ICD-10-CM | POA: Diagnosis not present

## 2012-05-24 DIAGNOSIS — Z1212 Encounter for screening for malignant neoplasm of rectum: Secondary | ICD-10-CM | POA: Diagnosis not present

## 2012-05-24 DIAGNOSIS — H25019 Cortical age-related cataract, unspecified eye: Secondary | ICD-10-CM | POA: Diagnosis not present

## 2012-06-03 DIAGNOSIS — Z1212 Encounter for screening for malignant neoplasm of rectum: Secondary | ICD-10-CM | POA: Diagnosis not present

## 2012-06-18 ENCOUNTER — Telehealth: Payer: Self-pay | Admitting: Cardiology

## 2012-06-18 NOTE — Telephone Encounter (Signed)
New Problem:    I called in an attempt to schedule an appointment and after speaking with the patient's wife, and her going to retrieve the patient and inform him about who was calling, the phone call was abruptly ended from their end while I was holding for the patient.  I attempted to call back and the call when straight to voicemail.  I left a message on their voicemail with my name, the reason I called, the name of their physician, and a number to call back to schedule their appointment.

## 2012-06-21 ENCOUNTER — Encounter: Payer: Self-pay | Admitting: Internal Medicine

## 2012-07-19 DIAGNOSIS — J209 Acute bronchitis, unspecified: Secondary | ICD-10-CM | POA: Diagnosis not present

## 2012-07-23 ENCOUNTER — Ambulatory Visit: Payer: Medicare Other | Admitting: Internal Medicine

## 2012-07-24 ENCOUNTER — Encounter: Payer: Self-pay | Admitting: *Deleted

## 2012-07-30 ENCOUNTER — Encounter: Payer: Self-pay | Admitting: Cardiology

## 2012-07-30 ENCOUNTER — Ambulatory Visit (INDEPENDENT_AMBULATORY_CARE_PROVIDER_SITE_OTHER): Payer: Medicare Other | Admitting: Cardiology

## 2012-07-30 VITALS — BP 122/88 | HR 76 | Ht 70.0 in | Wt 199.0 lb

## 2012-07-30 DIAGNOSIS — E785 Hyperlipidemia, unspecified: Secondary | ICD-10-CM | POA: Diagnosis not present

## 2012-07-30 DIAGNOSIS — R079 Chest pain, unspecified: Secondary | ICD-10-CM | POA: Diagnosis not present

## 2012-07-30 DIAGNOSIS — J449 Chronic obstructive pulmonary disease, unspecified: Secondary | ICD-10-CM | POA: Diagnosis not present

## 2012-07-30 DIAGNOSIS — I10 Essential (primary) hypertension: Secondary | ICD-10-CM

## 2012-07-30 NOTE — Progress Notes (Signed)
Patient ID: Michael [name], male   DOB: 12/31/1940, 72 y.o.   MRN: [id number] PCP: Dr. Eloise Harman  72 yo with history of HTN, hyperlipidemia, and spells of lightheadedness presents for cardiology followup. In 3/12, he had an ETT-myoview for atypical chest pain that showed no evidence for ischemia or infarction. Since I last saw him in 2012, he developed some exertional dyspnea.  He was sent for PFTs (which I cannot find in the system) and was told that he has COPD.  He was started on Spiriva and thinks that this has helped his breathing.  He does wheeze at times when he has a "cold" or URI.  He is doing more walking since starting Spiriva and only notes dyspnea with heavy exertion.  He gets very rare atypical chest pain.  This seems GERD-like as it is related to heavy meals.  Weight is stable.  BP is under good control on current regimen.  He has had no further lightheaded spells.   ECG: NSR with PACs, borderline LVH  Labs (11/11): LDL 144, HDL 68, creatinine 1.2 Labs (4/12): LDL 106, HDL 68  Allergies (verified):  No Known Drug Allergies  Past Medical History: 1. HTN 2. History of lightheadedness.  Workup for this about 1.5 years ago (Dr. Donnie Aho) included a 3 week event monitor and an echo, which were both unremarkable.  Of note, patient was no longer having lightheaded spells when he wore the monitor.  3.  Hyperlipidemia 4.  Colonic polyps 5.  Chest pain: ETT-myoview (3/12) with 9'30" exercise, no ischemic ECG changes, no evidence for ischemia or infarction by perfusion images, EF 64%.  6.  GERD 7. 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  He does help out there fairly frequently.  Smoked about 1 ppd but quit in 1984.  Married.  Serves as Personal assistant.   Current Outpatient Prescriptions  Medication Sig Dispense Refill  . ALPRAZolam (XANAX) 0.25 MG tablet Take 0.25 mg by mouth as needed.        Marland Kitchen  amLODipine (NORVASC) 5 MG tablet Take 1.5 tablets (7.5 mg total) by mouth daily.  180 tablet  3  . aspirin 81 MG tablet Take 81 mg by mouth daily.        . famotidine (PEPCID) 20 MG tablet Take 20 mg by mouth 2 (two) times daily.      Marland Kitchen ipratropium (ATROVENT) 0.02 % nebulizer solution Take 500 mcg by nebulization as directed.        Marland Kitchen lisinopril (PRINIVIL,ZESTRIL) 10 MG tablet Take 10 mg by mouth daily.        . Multiple Minerals-Vitamins (CITRACAL PLUS BONE DENSITY PO) Take 1 tablet by mouth daily.        . Multiple Vitamins-Minerals (CENTRUM SILVER PO) Take by mouth.      . simvastatin (ZOCOR) 40 MG tablet Take 40 mg by mouth every evening.      . tiotropium (SPIRIVA) 18 MCG inhalation capsule Place 18 mcg into inhaler and inhale daily.        BP 122/88  Pulse 76  Ht 5\' 10"  (1.778 m)  Wt 199 lb (90.266 kg)  BMI 28.55 kg/m2 General:  Well developed, well nourished, in no acute distress. Overweight.  Neck:  Neck supple, no JVD. No masses, thyromegaly or abnormal cervical nodes. Lungs:  Clear bilaterally to auscultation and percussion. Heart:  Non-displaced PMI, chest non-tender; regular rate and  rhythm, S1, S2 without murmurs, rubs or gallops. Carotid upstroke normal, no bruit.  Pedals normal pulses. No edema, no varicosities. Abdomen:  Bowel sounds positive; abdomen soft and non-tender without masses, organomegaly, or hernias noted. No hepatosplenomegaly. Extremities:  No clubbing or cyanosis. Neurologic:  Alert and oriented x 3. Psych:  Normal affect.  Assessment/Plan: 1. Chest pain: Very atypical, rare, and seems likely to be due to GERD.  He had a normal ETT-myoview in 3/12.  Continue ASA 81.  2. Hyperlipidemia: I will call Dr Silvano Rusk office to get a copy of the most recent lipids.   3. HTN: BP has been under good control.  4. COPD: Patient has been diagnosed by PFTs and has had improvement in dyspnea with Spiriva.  I cannot find the PFT report.  Patient had a lot of questions  about COPD which I attempted to answer today.  He would like to see a pulmonologist so I referred him to Dr. Marchelle Gearing.   Marca Ancona 07/30/2012

## 2012-07-30 NOTE — Patient Instructions (Addendum)
You have been referred to Dr Marchelle Gearing for evaluation and management of COPD.  Your physician wants you to follow-up in: 1 year with Dr Shirlee Latch. (January 2015). You will receive a reminder letter in the mail two months in advance. If you don't receive a letter, please call our office to schedule the follow-up appointment.

## 2012-08-07 DIAGNOSIS — H2589 Other age-related cataract: Secondary | ICD-10-CM | POA: Diagnosis not present

## 2012-08-07 DIAGNOSIS — H2510 Age-related nuclear cataract, unspecified eye: Secondary | ICD-10-CM | POA: Diagnosis not present

## 2012-08-07 DIAGNOSIS — H25019 Cortical age-related cataract, unspecified eye: Secondary | ICD-10-CM | POA: Diagnosis not present

## 2012-08-12 ENCOUNTER — Other Ambulatory Visit: Payer: Medicare Other

## 2012-08-12 ENCOUNTER — Ambulatory Visit (INDEPENDENT_AMBULATORY_CARE_PROVIDER_SITE_OTHER): Payer: Medicare Other | Admitting: Internal Medicine

## 2012-08-12 ENCOUNTER — Encounter: Payer: Self-pay | Admitting: Internal Medicine

## 2012-08-12 VITALS — BP 124/84 | HR 69 | Temp 98.2°F | Ht 70.0 in | Wt 198.6 lb

## 2012-08-12 DIAGNOSIS — J441 Chronic obstructive pulmonary disease with (acute) exacerbation: Secondary | ICD-10-CM | POA: Diagnosis not present

## 2012-08-12 DIAGNOSIS — J449 Chronic obstructive pulmonary disease, unspecified: Secondary | ICD-10-CM

## 2012-08-12 DIAGNOSIS — Z129 Encounter for screening for malignant neoplasm, site unspecified: Secondary | ICD-10-CM | POA: Diagnosis not present

## 2012-08-12 DIAGNOSIS — J984 Other disorders of lung: Secondary | ICD-10-CM

## 2012-08-12 DIAGNOSIS — Z87891 Personal history of nicotine dependence: Secondary | ICD-10-CM

## 2012-08-12 NOTE — Progress Notes (Signed)
Subjective:    Patient ID: Michael [name], male    DOB: [birth year], 72 y.o.   MRN: [id number]  HPI  PCP is Sheryn Bison, MD Referred by DR Marca Ancona  Body mass index is 28.5 kg/(m^2).  reports that he quit smoking about 30 years ago. His smoking use included Cigarettes. He has a 25 pack-year smoking history. He does not have any smokeless tobacco history on file.   IOV 08/12/2012  72 year old male referred by Dr. Shirlee Latch for evaluation of possible COPD   Michael [name] is 72 years old and has a 25 pack smoking history having quit in 1984. Around November 2012 he noticed chronic insidious onset dyspnea on exertion for 1 flight of stairs. He was seen by Dr. Jarold Motto. He had a spirometry that showed moderate obstruction with 11% bronchodilator response on FEV1. He was then started on Spiriva which is taken diligently all along. He noticed significant improvement in dyspnea with Spiriva and after that has helped baseline all along. Currently hardly has any dyspnea or limitations. Current CAT COPD score is 10 and reflects mild symptom burden. Most recently a month or 2 ago he was in [place] and had a viral bronchitis with sinus congestion. Apparently he was seen by a pulmonologist who felt that his COPD management needs to be more aggressive and therefore he is here. For that infection history with antibiotics but not with prednisone. He currently feels baseline as been noted. He at this point once better clarification of COPD management  We discussed lung cancer screening and he is interested in this        CAT COPD Symptom & Quality of Life Score (GSK trademark) 0 is no burden. 5 is highest burden 08/12/2012   Never Cough -> Cough all the time 2  No phlegm in chest -> Chest is full of phlegm 2  No chest tightness -> Chest feels very tight 1  No dyspnea for 1 flight stairs/hill -> Very dyspneic for 1 flight of stairs 2  No limitations for ADL at home -> Very limited with ADL at  home 0  Confident leaving home -> Not at all confident leaving home 0  Sleep soundly -> Do not sleep soundly because of lung condition 2  Lots of Energy -> No energy at all 1  TOTAL Score (max 40)  10       05/31/2011   05/31/2011   Fev1  2.17 L/67%  With 11% BD response  FVC  3.37 L/77%   Ratio  64/87%   fef 25-75%  47%  TLC   DLCO       Past Medical History  Diagnosis Date  . Essential hypertension, benign   . Hyperlipidemia   . History of colonic polyps   . Lightheadedness     workup for this about 1.5 years ago included a 3 week event monitor and an echo, which were both unremarkable. Of note, patient was no longer having lightheaded spells when he wore the monitor  . GERD (gastroesophageal reflux disease)   . Diverticulosis      Family History  Problem Relation Age of Onset  . Coronary artery disease Neg Hx   . Emphysema Father   . Cancer Mother      History   Social History  . Marital Status: Married    Spouse Name: N/A    Number of Children: N/A  . Years of Education: N/A   Occupational History  . retired    Social History  Main Topics  . Smoking status: Former Smoker -- 1.00 packs/day for 25 years    Types: Cigarettes    Quit date: 07/03/1982  . Smokeless tobacco: Not on file  . Alcohol Use: 1.0 oz/week    2 drink(s) per week  . Drug Use: No  . Sexually Active: Not on file   Other Topics Concern  . Not on file   Social History Narrative  . No narrative on file     No Known Allergies   Outpatient Prescriptions Prior to Visit  Medication Sig Dispense Refill  . ALPRAZolam (XANAX) 0.25 MG tablet Take 0.25 mg by mouth as needed.        Marland Kitchen amLODipine (NORVASC) 5 MG tablet Take 1.5 tablets (7.5 mg total) by mouth daily.  180 tablet  3  . aspirin 81 MG tablet Take 81 mg by mouth daily.        . famotidine (PEPCID) 20 MG tablet Take 20 mg by mouth daily. And as needed      . ipratropium (ATROVENT) 0.02 % nebulizer solution Take 500 mcg by  nebulization as directed.        Marland Kitchen lisinopril (PRINIVIL,ZESTRIL) 10 MG tablet Take 10 mg by mouth daily.        . Multiple Minerals-Vitamins (CITRACAL PLUS BONE DENSITY PO) Take 1 tablet by mouth daily.        . Multiple Vitamins-Minerals (CENTRUM SILVER PO) Take by mouth.      . simvastatin (ZOCOR) 40 MG tablet Take 40 mg by mouth every evening.      . tiotropium (SPIRIVA) 18 MCG inhalation capsule Place 18 mcg into inhaler and inhale daily.       No facility-administered medications prior to visit.      Review of Systems  Constitutional: Negative for fever and unexpected weight change.  HENT: Negative for ear pain, nosebleeds, congestion, sore throat, rhinorrhea, sneezing, trouble swallowing, dental problem, postnasal drip and sinus pressure.   Eyes: Negative for redness and itching.  Respiratory: Positive for shortness of breath. Negative for cough, chest tightness and wheezing.   Cardiovascular: Negative for palpitations and leg swelling.  Gastrointestinal: Negative for nausea and vomiting.  Genitourinary: Negative for dysuria.  Musculoskeletal: Negative for joint swelling.  Skin: Negative for rash.  Neurological: Negative for headaches.  Hematological: Does not bruise/bleed easily.  Psychiatric/Behavioral: Negative for dysphoric mood. The patient is not nervous/anxious.        Objective:   Physical Exam  Nursing note and vitals reviewed. Constitutional: He is oriented to person, place, and time. He appears well-developed and well-nourished. No distress.  HENT:  Head: Normocephalic and atraumatic.  Right Ear: External ear normal.  Left Ear: External ear normal.  Mouth/Throat: Oropharynx is clear and moist. No oropharyngeal exudate.  Eyes: Conjunctivae and EOM are normal. Pupils are equal, round, and reactive to light. Right eye exhibits no discharge. Left eye exhibits no discharge. No scleral icterus.  Neck: Normal range of motion. Neck supple. No JVD present. No tracheal  deviation present. No thyromegaly present.  Cardiovascular: Normal rate, regular rhythm and intact distal pulses.  Exam reveals no gallop and no friction rub.   No murmur heard. Pulmonary/Chest: Effort normal and breath sounds normal. No respiratory distress. He has no wheezes. He has no rales. He exhibits no tenderness.  Abdominal: Soft. Bowel sounds are normal. He exhibits no distension and no mass. There is no tenderness. There is no rebound and no guarding.  Musculoskeletal: Normal range of motion. He  exhibits no edema and no tenderness.  Lymphadenopathy:    He has no cervical adenopathy.  Neurological: He is alert and oriented to person, place, and time. He has normal reflexes. No cranial nerve deficit. Coordination normal.  Skin: Skin is warm and dry. No rash noted. He is not diaphoretic. No erythema. No pallor.  Psychiatric: He has a normal mood and affect. His behavior is normal. Judgment and thought content normal.          Assessment & Plan:

## 2012-08-12 NOTE — Assessment & Plan Note (Signed)
Discussed screening Ct chest for early detection of lung cancer Explained that in age 70-75 and smoking history, annual low dose CT chest can pick up lung cancer early and has potential to save lives and cure lung cancer This is similar to screening mammogram, colonoscopies and pap smears Explained Ct scan is low dose radiation Explained early lung cancer is curable but asymptomatic and best way to detect is CT scan Explained CT superior to CXR Explained that false positives are present and can incur cost and workup like biopsies, additional scan but benefit outweighs risk Explained currently out of pocket $300   Based on this he is willing to proceed with CAT scan of the chest

## 2012-08-12 NOTE — Patient Instructions (Addendum)
#  COPD - Please have full pulmonary function tests - Please have alpha 1 antitrypsin blood test For genetic cause of COPD - Continue Spiriva daily  #Lung cancer screening - Please have low-dose CT scan of the chest  #Followup - Return to see me after the above in the next few weeks

## 2012-08-13 NOTE — Assessment & Plan Note (Signed)
Spirometry back in 2012 showed moderate obstructive lung disease with significant reversibility. Symptoms since then have been well-controlled with Spiriva. He has had no respiratory exacerbations until the one recently when he was in [place]. Overall, he either has mild to moderate COPD or asthma. In order to optimally manage his COPD/asthma, I would like to know his current lung function status. I have therefore ordered pulmonary function testing.  I will also ordered alpha 1 antitrypsin check  He is up-to-date with his flu shot and Pneumovax  I will see him after pulmonary function testing

## 2012-08-16 ENCOUNTER — Inpatient Hospital Stay: Admission: RE | Admit: 2012-08-16 | Payer: Medicare Other | Source: Ambulatory Visit

## 2012-08-19 ENCOUNTER — Ambulatory Visit (INDEPENDENT_AMBULATORY_CARE_PROVIDER_SITE_OTHER)
Admission: RE | Admit: 2012-08-19 | Discharge: 2012-08-19 | Disposition: A | Discharge status: Home / Self Care | Payer: Medicare Other | Source: Ambulatory Visit | Attending: Internal Medicine | Admitting: Internal Medicine

## 2012-08-19 DIAGNOSIS — Z129 Encounter for screening for malignant neoplasm, site unspecified: Secondary | ICD-10-CM

## 2012-08-19 DIAGNOSIS — J438 Other emphysema: Secondary | ICD-10-CM | POA: Diagnosis not present

## 2012-08-19 DIAGNOSIS — J984 Other disorders of lung: Secondary | ICD-10-CM

## 2012-08-19 LAB — ALPHA-1 ANTITRYPSIN PHENOTYPE

## 2012-08-20 ENCOUNTER — Telehealth: Payer: Self-pay | Admitting: Internal Medicine

## 2012-08-20 DIAGNOSIS — J9859 Other diseases of mediastinum, not elsewhere classified: Secondary | ICD-10-CM

## 2012-08-20 DIAGNOSIS — Z87891 Personal history of nicotine dependence: Secondary | ICD-10-CM

## 2012-08-20 NOTE — Telephone Encounter (Signed)
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 I've updated patient about this. I will discuss at multidisciplinary thoracic oncology conference on Thursday, 08/22/2012 hopefully. He knows that I will call him back after discussion at thoracic oncology conference. Of note, patient now tells me that he's been having chronic pain in this location for long timenm pet  Please call patient and let him know that after some thought that I've decided to go ahead and get a PET scan and I ordered this

## 2012-08-21 ENCOUNTER — Ambulatory Visit: Payer: Medicare Other | Admitting: Internal Medicine

## 2012-08-21 DIAGNOSIS — H25019 Cortical age-related cataract, unspecified eye: Secondary | ICD-10-CM | POA: Diagnosis not present

## 2012-08-21 DIAGNOSIS — H52209 Unspecified astigmatism, unspecified eye: Secondary | ICD-10-CM | POA: Diagnosis not present

## 2012-08-21 DIAGNOSIS — H2510 Age-related nuclear cataract, unspecified eye: Secondary | ICD-10-CM | POA: Diagnosis not present

## 2012-08-21 DIAGNOSIS — H269 Unspecified cataract: Secondary | ICD-10-CM | POA: Diagnosis not present

## 2012-08-21 DIAGNOSIS — H2589 Other age-related cataract: Secondary | ICD-10-CM | POA: Diagnosis not present

## 2012-08-21 NOTE — Telephone Encounter (Addendum)
[id number]. Carron Curie, CMA Also let pt know that alpha 1 test is normal. Carron Curie, CMA

## 2012-08-21 NOTE — Telephone Encounter (Signed)
Pt called back and I spoke to him and gave him MR's message.  He would like a call back from [place], there is something he wants to speak to her about.

## 2012-08-21 NOTE — Telephone Encounter (Signed)
I spoke with the pt and he states that he wanted to pass on some information to MR for him to discuss at [facility] tomorrow. He states that 10-12 yrs ago he fell about 8 feet on his back and cracked a vertebrae and  he was told that his rib cage took the brunt of the force and that parts of his breast bone were damaged as well. He states it took him 6 months to a year to recover but he continued to have pain in that area. He states he then fell again 4 yrs ago on ice and that the same thing happened. I spoke with MR about this and his aware. Carron Curie, CMA

## 2012-08-23 ENCOUNTER — Telehealth: Payer: Self-pay | Admitting: Internal Medicine

## 2012-08-23 ENCOUNTER — Telehealth: Payer: Self-pay | Admitting: Cardiology

## 2012-08-23 DIAGNOSIS — J9859 Other diseases of mediastinum, not elsewhere classified: Secondary | ICD-10-CM

## 2012-08-23 NOTE — Telephone Encounter (Signed)
New problem    amlodpine & lisinopril  10 mg .    Prime mail

## 2012-08-23 NOTE — Telephone Encounter (Signed)
D/w Dr Tyrone Sage - he wants to see patient. Likely needs excision. Agrees with PET scan.  I updated patient 2:49 PM on 08/23/2012. Patient agrees to see DR Tyrone Sage .   Please get patient appt to see dr Rose Phi but after PET scan

## 2012-08-26 ENCOUNTER — Institutional Professional Consult (permissible substitution) (INDEPENDENT_AMBULATORY_CARE_PROVIDER_SITE_OTHER): Payer: Medicare Other | Admitting: Cardiothoracic Surgery

## 2012-08-26 ENCOUNTER — Encounter: Payer: Self-pay | Admitting: Cardiothoracic Surgery

## 2012-08-26 ENCOUNTER — Other Ambulatory Visit: Payer: Self-pay | Admitting: *Deleted

## 2012-08-26 ENCOUNTER — Other Ambulatory Visit: Payer: Self-pay

## 2012-08-26 VITALS — BP 141/86 | HR 82 | Resp 16 | Ht 70.0 in | Wt 188.0 lb

## 2012-08-26 DIAGNOSIS — D150 Benign neoplasm of thymus: Secondary | ICD-10-CM | POA: Insufficient documentation

## 2012-08-26 DIAGNOSIS — R222 Localized swelling, mass and lump, trunk: Secondary | ICD-10-CM | POA: Diagnosis not present

## 2012-08-26 DIAGNOSIS — D381 Neoplasm of uncertain behavior of trachea, bronchus and lung: Secondary | ICD-10-CM

## 2012-08-26 DIAGNOSIS — J9859 Other diseases of mediastinum, not elsewhere classified: Secondary | ICD-10-CM

## 2012-08-26 DIAGNOSIS — J449 Chronic obstructive pulmonary disease, unspecified: Secondary | ICD-10-CM | POA: Diagnosis not present

## 2012-08-26 MED ORDER — AMLODIPINE BESYLATE 5 MG PO TABS: 7.5000 mg | ORAL_TABLET | Freq: Every day | ORAL | Status: DC

## 2012-08-26 MED ORDER — LISINOPRIL 10 MG PO TABS: 10.0000 mg | ORAL_TABLET | Freq: Every day | ORAL | Status: DC

## 2012-08-26 NOTE — Progress Notes (Signed)
[address]            [place] [address]          [phone]      [facility] Medical Record #[id number] Date of Birth: 1940-12-13  Referring: Kalman Shan, MD Primary Care: Sheryn Bison, MD  Chief Complaint:    Chief Complaint  Patient presents with  . Mediastinal Mass    eval and treat...CT CHEST @ [facility] 08/19/12.Marland KitchenMarland KitchenPET scheduled for 08/29/12 and PFT'S 09/03/12    History of Present Illness:    Patient is a 72 year old male with a previous diagnosis of COPD. He notes that approximately one half years ago he had some vague shortness of breath with exertion. Screening pulmonary function studies suggest the an FEV1  2.1 67%. He was started on Spiriva she notes has helped some. He saw an emergency room physician in [place] who suggested that he see a pulmonologist when he returned home. He was seen in the bar pulmonology by Dr. Marchelle Gearing. Because of his previous history of smoking a pack a day for approximately 25 years and stopping 10/02/1982 a screening CT scan was ordered. 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 The patient denies any chest pain, has had no fever chills or night sweats, has no muscle weakness or history of myasthenia.   He does see lumbar cardiology for episodes of dizziness several years ago, a stress test was reported as normal at that time. He's had no known history of cardiac events denies consistent exertional related chest pain. His CT scan does reveal evidence of calcification of his coronary arteries, including calcium in the left main.     Current Activity/ Functional Status:  Patient is independent with mobility/ambulation, transfers, ADL's, IADL's.  Zubrod Score: At the time of surgery this patient's most appropriate activity status/level should be described as: []  Normal activity, no symptoms [x]  Symptoms, fully  ambulatory []  Symptoms, in bed less than or equal to 50% of the time []  Symptoms, in bed greater than 50% of the time but less than 100% []  Bedridden []  Moribund   Past Medical History  Diagnosis Date  . Essential hypertension, benign   . Hyperlipidemia   . History of colonic polyps   . Lightheadedness     workup for this about 1.5 years ago included a 3 week event monitor and an echo, which were both unremarkable. Of note, patient was no longer having lightheaded spells when he wore the monitor  . GERD (gastroesophageal reflux disease)   . Diverticulosis     Past Surgical History  Procedure Laterality Date  . Cataract extraction      Family History  Problem Relation Age of Onset  . Coronary artery disease Neg Hx   . Emphysema Father   . Cancer Mother    patient's father was a Catering manager died of emphysema at age 52 nonsmoker His mother died at age 78 with lung cancer was a long-term smoker He has 3 sons who are all healthy Half sister with history of breast cancer and a half brother who died with diabetes related disease 1 full brother with history of prostate cancer  History   Social History  . Marital Status: Married    Spouse Name: N/A    Number of Children: N/A  . Years of Education:  N/A   Occupational History  .  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 Smoking status: Former Smoker -- 1.00 packs/day for 25 years    Types: Cigarettes    Quit date: 07/03/1982  . Smokeless tobacco:  none   . Alcohol Use: 1.0 oz/week    2 drink(s) per week  . Drug Use: No   Other Topics Concern  . Not on file   Social History Narrative  . No narrative on file    History  Smoking status  . Former Smoker -- 1.00 packs/day for 25 years  . Types: Cigarettes  . Quit date: 07/03/1982  Smokeless tobacco  . Not on file    History    Alcohol Use  . 1.0 oz/week  . 2 drink(s) per week     No Known Allergies       Review of Systems:     Cardiac Review of Systems: Y or N  Chest Pain [  n  ]  Resting SOB [  n ] Exertional SOB  Cove.Etienne  ]  Orthopnea Milo.Brash  ]   Pedal Edema [ n  ]    Palpitations [ n ] Syncope  [  ]n   Presyncope [n   ]  General Review of Systems: [Y] = yes [  ]=no Constitional: recent weight change [n  ]; anorexia [  ]; fatigue [n  ]; nausea [n  ]; night sweats [  n]; fever [ n ]; or chills [n  ];                                                                                                                                          Dental: poor dentition[  ]; Last Dentist visit:   Eye : blurred vision [  ]; diplopia [   ]; vision changes [  ];  Amaurosis fugax[  ]; Resp: cough [  ];  wheezing[  ];  hemoptysis[  ]; shortness of breath[  ]; paroxysmal nocturnal dyspnea[  ]; dyspnea on exertion[  ]; or orthopnea[  ];  GI:  gallstones[  ], vomiting[  ];  dysphagia[  ]; melena[  ];  hematochezia [n  ]; heartburn[  ];   Hx of  Colonoscopy[ y ]; GU: kidney stones [  ]; hematuria[  ];   dysuria [  ];  nocturia[  ];  history of     obstruction [  ]; urinary frequency [  ]             Skin: rash, swelling[  ];, hair loss[  ];  peripheral edema[  ];  or itching[  ]; Musculosketetal: myalgias[  ];  joint swelling[  ];  joint erythema[  ];  joint pain[  ];  back pain[  ];  Heme/Lymph: bruising[  ];  bleeding[  ];  anemia[  ];  Neuro: TIA[  ];  headaches[  ];  stroke[  ];  vertigo[  ];  seizures[  ];   paresthesias[  ];  difficulty walking[  ];  Psych:depression[  ]; anxiety[  ];  Endocrine: diabetes[ n ];  thyroid dysfunction[ n ];  Immunizations: Flu Cove.Etienne  ]; Pneumococcal[y  ];  Other:  Physical Exam: BP 141/86  Pulse 82  Resp 16  Ht 5\' 10"  (1.778 m)  Wt 188 lb (85.276 kg)  BMI 26.98 kg/m2  SpO2 97%  General appearance: alert, cooperative, appears stated age and no distress Neurologic: intact Heart: regular  rate and rhythm, S1, S2 normal, no murmur, click, rub or gallop Lungs: clear to auscultation bilaterally Abdomen: soft, non-tender; bowel sounds normal; no masses,  no organomegaly Extremities: extremities normal, atraumatic, no cyanosis or edema and Homans sign is negative, no sign of DVT I do not appreciate any cervical or supraclavicular adenopathy no axillary adenopathy, he has no carotid bruits, has full and equal pulses bilaterally   Diagnostic Studies & Laboratory data:     Recent Radiology Findings:  Ct Chest Low Dose Pilot W/o Cm  08/20/2012  *RADIOLOGY REPORT*  Clinical Data: 72 year old male with a prior history of smoking. Lung cancer screening.  CT CHEST LOW DOSE PILOT WITHOUT CONTRAST  Technique: Multidetector CT imaging of the chest using the standard low-dose protocol without administration of intravenous contrast.  Comparison: No priors.  Findings:  Mediastinum: In the anterior mediastinum there is a 5.2 x 4.0 cm smoothly marginated ovoid shaped lesion that has heterogeneous internal attenuation.  There is a well-defined fat plane between this lesion and the adjacent proximal aortic arch.  No additional mediastinal or hilar lymph nodes or masses are otherwise noted. Heart size is normal. There is no significant pericardial fluid, thickening or pericardial calcification. There is atherosclerosis of the thoracic aorta, the great vessels of the mediastinum and the coronary arteries, including calcified atherosclerotic plaque in the left main coronary arteries. Esophagus is unremarkable in appearance.  Lungs/Pleura: There is a 2 mm nodule in the anterior aspect of the right upper lobe (image 101 of series 5), and a 3 mm subpleural nodule in the periphery of the left upper lobe (image 99 of series 5).  No other larger more suspicious appearing pulmonary nodules or masses are otherwise noted.  There is a background of mild centrilobular emphysema and mild diffuse bronchial wall thickening. No  acute consolidative airspace disease.  No pleural effusions. Linear opacities in the lower lobes of the lungs bilaterally may reflect areas of subsegmental atelectasis or scarring.  Upper Abdomen: Unremarkable.  Musculoskeletal: There are no aggressive appearing lytic or blastic lesions noted in the visualized portions of the skeleton.  IMPRESSION: 1.  Small 2 and 3 mm nodules in the right upper lobe and left upper lobe respectively, as discussed above. Annual low dose CT examination is recommended for the next 3 years and until the patient is 72 years of age.  This recommendation follows the NCCN Guidelines for Lung Cancer Screening. 2.  5.2 x 4.0 cm smoothly marginated anterior mediastinal mass. This has a heterogeneous internal attenuation suggesting internal areas of cystic change or necrosis.  Differential considerations favor an enlarged necrotic lymph node, potentially in the setting of unrecognized lymphoma.  Other considerations for an anterior mediastinal mass with this appearance include a germ cell neoplasm, thymic  lesion, or lesion of thyroid origin (however, a thyroidal lesion is not favored as this lesion is clearly separate from the thyroid gland). 3. Atherosclerosis, including left main coronary artery disease. Assessment for potential risk factor modification, dietary therapy or pharmacologic therapy may be warranted, if clinically indicated. 4.  Mild diffuse bronchial wall thickening with mild centrilobular and paraseptal emphysema.  These results will be called to the ordering clinician or representative by the Radiologist Assistant, and communication documented in the PACS Dashboard.   Original Report Authenticated By: Trudie Reed, M.D.      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 We discussed possible needle biopsy but especially if this is a thymoma we will still not know if it is malignant or not. The patient is to have a PET scan later this week and following this we'll see him and we will make plans to proceed with surgical resection. His questions have been answered in detail and he is willing to proceed.  We will have pulmonary function studies and and PET scan done on Thursday, February 27 He's recently seen Dr. Golden Circle for cardiology we will check with him if he thinks he needs any further evaluation for his cardiac status other than the stress test several years ago.    Delight Ovens MD  Beeper [phone] Office [phone] 08/26/2012 1:58 PM

## 2012-08-27 ENCOUNTER — Telehealth: Payer: Self-pay | Admitting: *Deleted

## 2012-08-27 NOTE — Telephone Encounter (Signed)
Spoke with pt. Pt just got a 90 day supply of simvastatin. He would like to continue simvastatin for now,especially with other medical issues at this time. He will call back when he is about out of simvastatin. At that time he will start atorvastatin 40mg  in the place of simvastatin. He will schedule an  appt for fasting lipid /liver profile to be done 2 months after he starts atorvastatin.

## 2012-08-27 NOTE — Telephone Encounter (Signed)
Done. Michael [name], CMA  

## 2012-08-27 NOTE — Telephone Encounter (Signed)
Marjo Bicker, MD        Mon August 26, 2012 5:00 PM                             Thurston Hole, I told Dr. Tyrone Sage that Mr Lenhoff can have mediastinal mass resection without further workup if no new symptoms. He did have calcification in his coronaries suggesting some degree of CAD. Would suggest that he come off simvastatin and instead take atorvastatin 40 (intensify statin) with lipids/LFTs in 2 months.     Michael [name]

## 2012-08-29 ENCOUNTER — Ambulatory Visit (HOSPITAL_COMMUNITY)
Admission: RE | Admit: 2012-08-29 | Discharge: 2012-08-29 | Disposition: A | Discharge status: Home / Self Care | Payer: Medicare Other | Source: Ambulatory Visit | Attending: Cardiothoracic Surgery | Admitting: Cardiothoracic Surgery

## 2012-08-29 ENCOUNTER — Encounter: Payer: Self-pay | Admitting: Cardiothoracic Surgery

## 2012-08-29 ENCOUNTER — Other Ambulatory Visit: Payer: Self-pay | Admitting: *Deleted

## 2012-08-29 ENCOUNTER — Ambulatory Visit (INDEPENDENT_AMBULATORY_CARE_PROVIDER_SITE_OTHER): Payer: Medicare Other | Admitting: Cardiothoracic Surgery

## 2012-08-29 ENCOUNTER — Encounter (HOSPITAL_COMMUNITY)
Admission: RE | Admit: 2012-08-29 | Discharge: 2012-08-29 | Disposition: A | Discharge status: Home / Self Care | Payer: Medicare Other | Source: Ambulatory Visit | Attending: Internal Medicine | Admitting: Internal Medicine

## 2012-08-29 ENCOUNTER — Encounter (HOSPITAL_COMMUNITY): Payer: Self-pay | Admitting: Pharmacy Technician

## 2012-08-29 VITALS — BP 124/81 | HR 94 | Resp 18 | Ht 69.0 in | Wt 195.0 lb

## 2012-08-29 DIAGNOSIS — R0989 Other specified symptoms and signs involving the circulatory and respiratory systems: Secondary | ICD-10-CM | POA: Diagnosis not present

## 2012-08-29 DIAGNOSIS — R222 Localized swelling, mass and lump, trunk: Secondary | ICD-10-CM | POA: Diagnosis not present

## 2012-08-29 DIAGNOSIS — J9859 Other diseases of mediastinum, not elsewhere classified: Secondary | ICD-10-CM

## 2012-08-29 DIAGNOSIS — J988 Other specified respiratory disorders: Secondary | ICD-10-CM | POA: Diagnosis not present

## 2012-08-29 DIAGNOSIS — R0609 Other forms of dyspnea: Secondary | ICD-10-CM | POA: Insufficient documentation

## 2012-08-29 DIAGNOSIS — J449 Chronic obstructive pulmonary disease, unspecified: Secondary | ICD-10-CM | POA: Diagnosis not present

## 2012-08-29 DIAGNOSIS — Z87891 Personal history of nicotine dependence: Secondary | ICD-10-CM

## 2012-08-29 DIAGNOSIS — D381 Neoplasm of uncertain behavior of trachea, bronchus and lung: Secondary | ICD-10-CM

## 2012-08-29 LAB — PULMONARY FUNCTION TEST

## 2012-08-29 MED ORDER — ALBUTEROL SULFATE (5 MG/ML) 0.5% IN NEBU
2.5000 mg | INHALATION_SOLUTION | Freq: Once | RESPIRATORY_TRACT | Status: AC
  Administered 2012-08-29: 2.5 mg via RESPIRATORY_TRACT

## 2012-08-29 MED ORDER — FLUDEOXYGLUCOSE F - 18 (FDG) INJECTION
19.8000 | Freq: Once | INTRAVENOUS | Status: AC | PRN
  Administered 2012-08-29: 19.8 via INTRAVENOUS

## 2012-08-29 NOTE — Progress Notes (Signed)
[address]       [place] [address]             [phone]       Jahmeer Porche [facility] Medical Record #[id number] Date of Birth: [birth year]  Referring: Kalman Shan, MD Primary Care: Garlan Fillers, MD  Chief Complaint:    Chief Complaint  Patient presents with  . Mediastinal Mass    further discuss surgery, S/P PET Scan and PFT's from 08/29/12    History of Present Illness:    Patient is a 72 year old male with a previous diagnosis of COPD. He notes that approximately one half years ago he had some vague shortness of breath with exertion. Screening pulmonary function studies suggest the an FEV1  2.1 67%. He was started on Spiriva she notes has helped some. He saw an emergency room physician in [place] who suggested that he see a pulmonologist when he returned home. He was seen in the bar pulmonology by Dr. Marchelle Gearing. Because of his previous history of smoking a pack a day for approximately 25 years and stopping 10/02/1982 a screening CT scan was ordered. 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 The patient denies any chest pain, has had no fever chills or night sweats, has no muscle weakness or history of myasthenia.   He does see lumbar cardiology for episodes of dizziness several years ago, a stress test was reported as normal at that time. He's had no known history of cardiac events denies consistent exertional related chest pain. His CT scan does reveal evidence of calcification of his coronary arteries, including calcium in the left main.  The patient returns to the office today after a. Pulmonary function studies have been performed and a PET scan was completed this morning. In addition I contacted his cardiologist who is change his statin but from his recent visit did not feel any further workup was indicated prior to thoracic surgery. The patient denies any angina  evidence of congestive heart failure or any previous history of myocardial infarction.   Current Activity/ Functional Status:  Patient is independent with mobility/ambulation, transfers, ADL's, IADL's.  Zubrod Score: At the time of surgery this patient's most appropriate activity status/level should be described as: []  Normal activity, no symptoms [x]  Symptoms, fully ambulatory []  Symptoms, in bed less than or equal to 50% of the time []  Symptoms, in bed greater than 50% of the time but less than 100% []  Bedridden []  Moribund   Past Medical History  Diagnosis Date  . Essential hypertension, benign   . Hyperlipidemia   . History of colonic polyps   . Lightheadedness     workup for this about 1.5 years ago included a 3 week event monitor and an echo, which were both unremarkable. Of note, patient was no longer having lightheaded spells when he wore the monitor  . GERD (gastroesophageal reflux disease)   . Diverticulosis     Past Surgical History  Procedure Laterality Date  . Cataract extraction      Family History  Problem Relation Age of Onset  . Coronary artery disease Neg Hx   . Emphysema Father   . Cancer Mother    patient's father was a Catering manager died of emphysema at age 5 nonsmoker His mother died at age 61 with lung cancer was a long-term smoker He has 3 sons who are all healthy Half sister with history of breast cancer  and a half brother who died with diabetes related disease 1 full brother with history of prostate cancer  History   Social History  . Marital Status: Married    Spouse Name: N/A    Number of Children: N/A  . Years of Education: N/A   Occupational History  .  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 Smoking status: Former Smoker -- 1.00 packs/day for 25 years    Types: Cigarettes    Quit date: 07/03/1982    . Smokeless tobacco:  none   . Alcohol Use: 1.0 oz/week    2 drink(s) per week  . Drug Use: No    History  Smoking status  . Former Smoker -- 1.00 packs/day for 25 years  . Types: Cigarettes  . Quit date: 07/03/1982  Smokeless tobacco  . Not on file    History  Alcohol Use  . 1.0 oz/week  . 2 drink(s) per week     No Known Allergies       Review of Systems:     Cardiac Review of Systems: Y or N  Chest Pain [  n  ]  Resting SOB [  n ] Exertional SOB  Cove.Etienne  ]  Orthopnea Milo.Brash  ]   Pedal Edema [ n  ]    Palpitations [ n ] Syncope  [  ]n   Presyncope [n   ]  General Review of Systems: [Y] = yes [  ]=no Constitional: recent weight change [n  ]; anorexia [  ]; fatigue [n  ]; nausea [n  ]; night sweats [  n]; fever [ n ]; or chills [n  ];                                                                                                                                          Dental: poor dentition[  ]; Last Dentist visit:   Eye : blurred vision [  ]; diplopia [   ]; vision changes [  ];  Amaurosis fugax[  ]; Resp: cough [  ];  wheezing[  ];  hemoptysis[  ]; shortness of breath[  ]; paroxysmal nocturnal dyspnea[  ]; dyspnea on exertion[  ]; or orthopnea[  ];  GI:  gallstones[  ], vomiting[  ];  dysphagia[  ]; melena[  ];  hematochezia [n  ]; heartburn[  ];   Hx of  Colonoscopy[ y ]; GU: kidney stones [  ]; hematuria[  ];   dysuria [  ];  nocturia[  ];  history of     obstruction [  ]; urinary frequency [  ]             Skin: rash, swelling[  ];,  hair loss[  ];  peripheral edema[  ];  or itching[  ]; Musculosketetal: myalgias[  ];  joint swelling[  ];  joint erythema[  ];  joint pain[  ];  back pain[  ];  Heme/Lymph: bruising[  ];  bleeding[  ];  anemia[  ];  Neuro: TIA[  ];  headaches[  ];  stroke[  ];  vertigo[  ];  seizures[  ];   paresthesias[  ];  difficulty walking[  ];  Psych:depression[  ]; anxiety[  ];  Endocrine: diabetes[ n ];  thyroid dysfunction[ n ];  Immunizations: Flu  Cove.Etienne  ]; Pneumococcal[y  ];  Other:  Physical Exam: BP 124/81  Pulse 94  Resp 18  Ht 5\' 9"  (1.753 m)  Wt 195 lb (88.451 kg)  BMI 28.78 kg/m2  SpO2 98%  General appearance: alert, cooperative, appears stated age and no distress Neurologic: intact Heart: regular rate and rhythm, S1, S2 normal, no murmur, click, rub or gallop Lungs: clear to auscultation bilaterally Abdomen: soft, non-tender; bowel sounds normal; no masses,  no organomegaly Extremities: extremities normal, atraumatic, no cyanosis or edema and Homans sign is negative, no sign of DVT I do not appreciate any cervical or supraclavicular adenopathy no axillary adenopathy, he has no carotid bruits, has full and equal pulses bilaterally   Diagnostic Studies & Laboratory data:     Recent Radiology Findings:  Ct Chest Low Dose Pilot W/o Cm  08/20/2012  *RADIOLOGY REPORT*  Clinical Data: 72 year old male with a prior history of smoking. Lung cancer screening.  CT CHEST LOW DOSE PILOT WITHOUT CONTRAST  Technique: Multidetector CT imaging of the chest using the standard low-dose protocol without administration of intravenous contrast.  Comparison: No priors.  Findings:  Mediastinum: In the anterior mediastinum there is a 5.2 x 4.0 cm smoothly marginated ovoid shaped lesion that has heterogeneous internal attenuation.  There is a well-defined fat plane between this lesion and the adjacent proximal aortic arch.  No additional mediastinal or hilar lymph nodes or masses are otherwise noted. Heart size is normal. There is no significant pericardial fluid, thickening or pericardial calcification. There is atherosclerosis of the thoracic aorta, the great vessels of the mediastinum and the coronary arteries, including calcified atherosclerotic plaque in the left main coronary arteries. Esophagus is unremarkable in appearance.  Lungs/Pleura: There is a 2 mm nodule in the anterior aspect of the right upper lobe (image 101 of series 5), and a 3 mm  subpleural nodule in the periphery of the left upper lobe (image 99 of series 5).  No other larger more suspicious appearing pulmonary nodules or masses are otherwise noted.  There is a background of mild centrilobular emphysema and mild diffuse bronchial wall thickening. No acute consolidative airspace disease.  No pleural effusions. Linear opacities in the lower lobes of the lungs bilaterally may reflect areas of subsegmental atelectasis or scarring.  Upper Abdomen: Unremarkable.  Musculoskeletal: There are no aggressive appearing lytic or blastic lesions noted in the visualized portions of the skeleton.  IMPRESSION: 1.  Small 2 and 3 mm nodules in the right upper lobe and left upper lobe respectively, as discussed above. Annual low dose CT examination is recommended for the next 3 years and until the patient is 72 years of age.  This recommendation follows the NCCN Guidelines for Lung Cancer Screening. 2.  5.2 x 4.0 cm smoothly marginated anterior mediastinal mass. This has a heterogeneous internal attenuation suggesting internal areas of cystic change or necrosis.  Differential considerations favor  an enlarged necrotic lymph node, potentially in the setting of unrecognized lymphoma.  Other considerations for an anterior mediastinal mass with this appearance include a germ cell neoplasm, thymic lesion, or lesion of thyroid origin (however, a thyroidal lesion is not favored as this lesion is clearly separate from the thyroid gland). 3. Atherosclerosis, including left main coronary artery disease. Assessment for potential risk factor modification, dietary therapy or pharmacologic therapy may be warranted, if clinically indicated. 4.  Mild diffuse bronchial wall thickening with mild centrilobular and paraseptal emphysema.  These results will be called to the ordering clinician or representative by the Radiologist Assistant, and communication documented in the PACS Dashboard.   Original Report Authenticated By:  Trudie Reed, M.D.     Nm Pet Image Initial (pi) Skull Base To Thigh  08/29/2012  *RADIOLOGY REPORT*  Clinical Data: Initial treatment strategy for anterior mediastinal mass.  NUCLEAR MEDICINE PET SKULL BASE TO THIGH  Fasting Blood Glucose:  106  Technique:  19.8  mCi F-18 FDG was injected intravenously. CT data was obtained and used for attenuation correction and anatomic localization only.  (This was not acquired as a diagnostic CT examination.) Additional exam technical data entered on technologist worksheet.  Comparison:  CT thorax 08/19/2012  Findings:  Neck: No hypermetabolic lymph nodes in the neck.  Chest:  Within the anterior mediastinum there is a rounded 5.3 x 3.9 cm mass which is heterogeneous in density on noncontrast CT. The portion of the lesion which is more dense has intense metabolic activity with SUV max = 8.2.  No additional hypermetabolic masses or nodes in the mediastinum.  No hypermetabolic pulmonary nodules.  Abdomen/Pelvis:  No abnormal hypermetabolic activity within the liver, pancreas, adrenal glands, or spleen.  No hypermetabolic lymph nodes in the abdomen or pelvis.  Skeleton:  No focal hypermetabolic activity to suggest skeletal metastasis.  IMPRESSION:  1.  Hypermetabolic rounded anterior mediastinal mass is consistent with benign or malignant neoplasm.  Recommend biopsy versus surgical resection. 2.  No evidence of local nodal metastasis or distant metastasis.   Original Report Authenticated By: Genevive Bi, M.D.    Recent Lab Findings: Lab Results  Component Value Date   WBC 9.3 11/19/2008   HGB 16.1 11/19/2008   HCT 47.4 11/19/2008   PLT 221 11/19/2008   GLUCOSE 110* 11/19/2008   CHOL 186 10/24/2010   TRIG 59.0 10/24/2010   HDL 67.80 10/24/2010   LDLCALC 106* 10/24/2010   ALT 32 10/24/2010   AST 46* 10/24/2010   NA 138 11/19/2008   K 4.3 11/19/2008   CL 107 11/19/2008   CREATININE 1.48 11/19/2008   BUN 20 11/19/2008   CO2 25 11/19/2008   PFT's  FEV1=2.4 76%  DLCO  27.97  90%   Assessment / Plan:   #1 new diagnosis of 5 cm anterior mediastinal mass, hypermetabolic without any evidence of other hypermetabolic disease  #2 history of hypertension #3 history of COPD   I reviewed the radiographic findings with the patient and discussed the possible diagnosis of a 5 cm anterior mediastinal mass. 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  I discussed with patient proceeding with left chest approach to resection with a anterior thoracotomy incision an additional port sites.  He's recently seen Dr. Golden Circle for cardiology who did not  think he needed any further evaluation for his cardiac  status other than the stress test several years ago. Tentatively plan surgery for Tuesday, March 4   Delight Ovens MD  Beeper [phone] Office [phone] 08/29/2012 4:38 PM

## 2012-08-30 ENCOUNTER — Telehealth: Payer: Self-pay | Admitting: Internal Medicine

## 2012-08-30 NOTE — Telephone Encounter (Signed)
He knows pet results. Surgery 09/04/12. He will rexchedule with me in few to several weeks after surgery

## 2012-08-30 NOTE — Pre-Procedure Instructions (Addendum)
Michael [name]  08/30/2012   Your procedure is scheduled on:  Tuesday, March 4th.  Report to [facility] at 5:30AM.  Call this number if you have problems the morning of surgery: [phone]   Remember:   Do not eat food or drink liquids after midnight.    Take these medicines the morning of surgery with A SIP OF WATER:Xanax, Amlodipine (Norvasc).  Use Tiotropium (Spiriva) . May take Famotidine (Pepcid)     Do not wear jewelry, make-up or nail polish.  Do not wear lotions, powders, or perfumes. You may wear deodorant.  Men may shave face and neck.  Do not bring valuables to the hospital.  Contacts, dentures or bridgework may not be worn into surgery.  Leave suitcase in the car. After surgery it may be brought to your room.  For patients admitted to the hospital, checkout time is 11:00 AM the day of  discharge.     Special Instructions: Shower with CHG wash (Bactoshield) tonight and again in the am prior to arriving to hospital.   Please read over the following fact sheets that you were given: Pain Booklet, Coughing and Deep Breathing, Blood Transfusion Information and Surgical Site Infection Prevention

## 2012-09-02 ENCOUNTER — Encounter (HOSPITAL_COMMUNITY)
Admission: RE | Admit: 2012-09-02 | Discharge: 2012-09-02 | Disposition: A | Discharge status: Home / Self Care | Payer: Medicare Other | Source: Ambulatory Visit | Attending: Cardiothoracic Surgery | Admitting: Cardiothoracic Surgery

## 2012-09-02 ENCOUNTER — Encounter (HOSPITAL_COMMUNITY): Payer: Self-pay

## 2012-09-02 VITALS — BP 145/78 | HR 72 | Temp 97.7°F | Resp 20 | Ht 69.0 in | Wt 188.0 lb

## 2012-09-02 DIAGNOSIS — J90 Pleural effusion, not elsewhere classified: Secondary | ICD-10-CM | POA: Diagnosis not present

## 2012-09-02 DIAGNOSIS — Z4682 Encounter for fitting and adjustment of non-vascular catheter: Secondary | ICD-10-CM | POA: Diagnosis not present

## 2012-09-02 DIAGNOSIS — Z01818 Encounter for other preprocedural examination: Secondary | ICD-10-CM | POA: Diagnosis not present

## 2012-09-02 DIAGNOSIS — J9859 Other diseases of mediastinum, not elsewhere classified: Secondary | ICD-10-CM

## 2012-09-02 DIAGNOSIS — J449 Chronic obstructive pulmonary disease, unspecified: Secondary | ICD-10-CM | POA: Diagnosis not present

## 2012-09-02 DIAGNOSIS — R222 Localized swelling, mass and lump, trunk: Secondary | ICD-10-CM | POA: Diagnosis not present

## 2012-09-02 DIAGNOSIS — R599 Enlarged lymph nodes, unspecified: Secondary | ICD-10-CM | POA: Diagnosis not present

## 2012-09-02 DIAGNOSIS — Z87891 Personal history of nicotine dependence: Secondary | ICD-10-CM | POA: Diagnosis not present

## 2012-09-02 DIAGNOSIS — D382 Neoplasm of uncertain behavior of pleura: Secondary | ICD-10-CM | POA: Diagnosis not present

## 2012-09-02 DIAGNOSIS — J9819 Other pulmonary collapse: Secondary | ICD-10-CM | POA: Diagnosis not present

## 2012-09-02 DIAGNOSIS — I10 Essential (primary) hypertension: Secondary | ICD-10-CM | POA: Diagnosis not present

## 2012-09-02 DIAGNOSIS — E785 Hyperlipidemia, unspecified: Secondary | ICD-10-CM | POA: Diagnosis not present

## 2012-09-02 DIAGNOSIS — K219 Gastro-esophageal reflux disease without esophagitis: Secondary | ICD-10-CM | POA: Diagnosis not present

## 2012-09-02 DIAGNOSIS — J4489 Other specified chronic obstructive pulmonary disease: Secondary | ICD-10-CM | POA: Diagnosis not present

## 2012-09-02 HISTORY — DX: Chronic obstructive pulmonary disease, unspecified: J44.9

## 2012-09-02 LAB — CBC
HCT: 43.1 % (ref 39.0–52.0)
Hemoglobin: 15.1 g/dL (ref 13.0–17.0)
MCH: 31.5 pg (ref 26.0–34.0)
MCHC: 35 g/dL (ref 30.0–36.0)
MCV: 90 fL (ref 78.0–100.0)
Platelets: 252 10*3/uL (ref 150–400)
RBC: 4.79 MIL/uL (ref 4.22–5.81)
RDW: 13.5 % (ref 11.5–15.5)
WBC: 7.7 10*3/uL (ref 4.0–10.5)

## 2012-09-02 LAB — BLOOD GAS, ARTERIAL
Acid-base deficit: 0.7 mmol/L (ref 0.0–2.0)
Bicarbonate: 22.7 mEq/L (ref 20.0–24.0)
Drawn by: 344381
FIO2: 0.21 %
O2 Saturation: 98.3 %
Patient temperature: 98.6
TCO2: 23.7 mmol/L (ref 0–100)
pCO2 arterial: 33.1 mmHg — ABNORMAL LOW (ref 35.0–45.0)
pH, Arterial: 7.451 — ABNORMAL HIGH (ref 7.350–7.450)
pO2, Arterial: 96.5 mmHg (ref 80.0–100.0)

## 2012-09-02 LAB — URINALYSIS, ROUTINE W REFLEX MICROSCOPIC
Bilirubin Urine: NEGATIVE
Glucose, UA: NEGATIVE mg/dL
Hgb urine dipstick: NEGATIVE
Ketones, ur: NEGATIVE mg/dL
Nitrite: NEGATIVE
Protein, ur: NEGATIVE mg/dL
Specific Gravity, Urine: 1.023 (ref 1.005–1.030)
Urobilinogen, UA: 0.2 mg/dL (ref 0.0–1.0)
pH: 6.5 (ref 5.0–8.0)

## 2012-09-02 LAB — COMPREHENSIVE METABOLIC PANEL
ALT: 21 U/L (ref 0–53)
AST: 21 U/L (ref 0–37)
Albumin: 3.7 g/dL (ref 3.5–5.2)
Alkaline Phosphatase: 60 U/L (ref 39–117)
BUN: 18 mg/dL (ref 6–23)
CO2: 25 mEq/L (ref 19–32)
Calcium: 9.5 mg/dL (ref 8.4–10.5)
Chloride: 102 mEq/L (ref 96–112)
Creatinine, Ser: 1.21 mg/dL (ref 0.50–1.35)
GFR calc Af Amer: 69 mL/min — ABNORMAL LOW (ref >90)
GFR calc non Af Amer: 59 mL/min — ABNORMAL LOW (ref >90)
Glucose, Bld: 101 mg/dL — ABNORMAL HIGH (ref 70–99)
Potassium: 4.3 mEq/L (ref 3.5–5.1)
Sodium: 137 mEq/L (ref 135–145)
Total Bilirubin: 0.7 mg/dL (ref 0.3–1.2)
Total Protein: 7.1 g/dL (ref 6.0–8.3)

## 2012-09-02 LAB — ABO/RH: ABO/RH(D): B POS

## 2012-09-02 LAB — URINE MICROSCOPIC-ADD ON

## 2012-09-02 LAB — SURGICAL PCR SCREEN
MRSA, PCR: NEGATIVE
Staphylococcus aureus: NEGATIVE

## 2012-09-02 LAB — TYPE AND SCREEN
ABO/RH(D): B POS
Antibody Screen: NEGATIVE

## 2012-09-02 LAB — APTT: aPTT: 30 seconds (ref 24–37)

## 2012-09-02 LAB — PROTIME-INR
INR: 0.95 (ref 0.00–1.49)
Prothrombin Time: 12.6 seconds (ref 11.6–15.2)

## 2012-09-02 MED ORDER — DEXTROSE 5 % IV SOLN
1.5000 g | INTRAVENOUS | Status: AC
  Administered 2012-09-03: 1.5 g via INTRAVENOUS
  Filled 2012-09-02: qty 1.5

## 2012-09-02 NOTE — Progress Notes (Signed)
Note dr Shirlee Latch 1/14, pft's 2/14

## 2012-09-03 ENCOUNTER — Inpatient Hospital Stay (HOSPITAL_COMMUNITY)
Admission: RE | Admit: 2012-09-03 | Discharge: 2012-09-05 | DRG: 165 | Disposition: A | Discharge status: Home / Self Care | Payer: Medicare Other | Source: Ambulatory Visit | Attending: Cardiothoracic Surgery | Admitting: Cardiothoracic Surgery

## 2012-09-03 ENCOUNTER — Inpatient Hospital Stay (HOSPITAL_COMMUNITY): Payer: Medicare Other | Admitting: Anesthesiology

## 2012-09-03 ENCOUNTER — Inpatient Hospital Stay (HOSPITAL_COMMUNITY): Payer: Medicare Other

## 2012-09-03 ENCOUNTER — Encounter (HOSPITAL_COMMUNITY): Payer: Self-pay | Admitting: *Deleted

## 2012-09-03 ENCOUNTER — Encounter (HOSPITAL_COMMUNITY)
Admission: RE | Disposition: A | Discharge status: Home / Self Care | Payer: Self-pay | Source: Ambulatory Visit | Attending: Cardiothoracic Surgery

## 2012-09-03 ENCOUNTER — Encounter (HOSPITAL_COMMUNITY): Payer: Self-pay | Admitting: Anesthesiology

## 2012-09-03 DIAGNOSIS — R222 Localized swelling, mass and lump, trunk: Secondary | ICD-10-CM

## 2012-09-03 DIAGNOSIS — J9859 Other diseases of mediastinum, not elsewhere classified: Secondary | ICD-10-CM

## 2012-09-03 DIAGNOSIS — Z87891 Personal history of nicotine dependence: Secondary | ICD-10-CM | POA: Diagnosis not present

## 2012-09-03 DIAGNOSIS — E785 Hyperlipidemia, unspecified: Secondary | ICD-10-CM | POA: Diagnosis present

## 2012-09-03 DIAGNOSIS — J449 Chronic obstructive pulmonary disease, unspecified: Secondary | ICD-10-CM | POA: Diagnosis not present

## 2012-09-03 DIAGNOSIS — D382 Neoplasm of uncertain behavior of pleura: Secondary | ICD-10-CM | POA: Diagnosis not present

## 2012-09-03 DIAGNOSIS — J9819 Other pulmonary collapse: Secondary | ICD-10-CM | POA: Diagnosis not present

## 2012-09-03 DIAGNOSIS — K219 Gastro-esophageal reflux disease without esophagitis: Secondary | ICD-10-CM | POA: Diagnosis not present

## 2012-09-03 DIAGNOSIS — D150 Benign neoplasm of thymus: Secondary | ICD-10-CM | POA: Diagnosis present

## 2012-09-03 DIAGNOSIS — R599 Enlarged lymph nodes, unspecified: Secondary | ICD-10-CM | POA: Diagnosis not present

## 2012-09-03 DIAGNOSIS — I10 Essential (primary) hypertension: Secondary | ICD-10-CM | POA: Diagnosis not present

## 2012-09-03 DIAGNOSIS — J4489 Other specified chronic obstructive pulmonary disease: Secondary | ICD-10-CM | POA: Diagnosis present

## 2012-09-03 HISTORY — PX: FLEXIBLE BRONCHOSCOPY: SHX5094

## 2012-09-03 HISTORY — PX: VIDEO ASSISTED THORACOSCOPY (VATS)/WEDGE RESECTION: SHX6174

## 2012-09-03 SURGERY — VIDEO ASSISTED THORACOSCOPY (VATS)/WEDGE RESECTION
Anesthesia: General | Site: Chest | Wound class: Clean Contaminated

## 2012-09-03 MED ORDER — ADULT MULTIVITAMIN W/MINERALS CH
1.0000 | ORAL_TABLET | Freq: Every day | ORAL | Status: DC
  Administered 2012-09-04 – 2012-09-05 (×2): 1 via ORAL
  Filled 2012-09-03 (×3): qty 1

## 2012-09-03 MED ORDER — CALCIUM CARBONATE ANTACID 500 MG PO CHEW
1.0000 | CHEWABLE_TABLET | Freq: Three times a day (TID) | ORAL | Status: DC | PRN
  Administered 2012-09-03: 200 mg via ORAL
  Filled 2012-09-03 (×3): qty 1

## 2012-09-03 MED ORDER — IPRATROPIUM BROMIDE 0.06 % NA SOLN
1.0000 | Freq: Two times a day (BID) | NASAL | Status: DC
  Administered 2012-09-03 – 2012-09-04 (×2): 1 via NASAL
  Filled 2012-09-03: qty 15

## 2012-09-03 MED ORDER — POTASSIUM CHLORIDE 10 MEQ/50ML IV SOLN: 10.0000 meq | Freq: Every day | INTRAVENOUS | Status: DC | PRN

## 2012-09-03 MED ORDER — HYDROMORPHONE HCL PF 1 MG/ML IJ SOLN
INTRAMUSCULAR | Status: AC
  Filled 2012-09-03: qty 1

## 2012-09-03 MED ORDER — OXYCODONE-ACETAMINOPHEN 5-325 MG PO TABS
1.0000 | ORAL_TABLET | ORAL | Status: DC | PRN
  Administered 2012-09-05: 1 via ORAL
  Filled 2012-09-03: qty 1

## 2012-09-03 MED ORDER — MIDAZOLAM HCL 5 MG/5ML IJ SOLN
INTRAMUSCULAR | Status: DC | PRN
  Administered 2012-09-03: 2 mg via INTRAVENOUS

## 2012-09-03 MED ORDER — ROCURONIUM BROMIDE 100 MG/10ML IV SOLN
INTRAVENOUS | Status: DC | PRN
  Administered 2012-09-03: 50 mg via INTRAVENOUS
  Administered 2012-09-03 (×2): 20 mg via INTRAVENOUS
  Administered 2012-09-03: 10 mg via INTRAVENOUS

## 2012-09-03 MED ORDER — ATORVASTATIN CALCIUM 20 MG PO TABS
20.0000 mg | ORAL_TABLET | Freq: Every day | ORAL | Status: DC
  Administered 2012-09-03 – 2012-09-04 (×2): 20 mg via ORAL
  Filled 2012-09-03 (×3): qty 1

## 2012-09-03 MED ORDER — DIPHENHYDRAMINE HCL 50 MG/ML IJ SOLN: 12.5000 mg | Freq: Four times a day (QID) | INTRAMUSCULAR | Status: DC | PRN

## 2012-09-03 MED ORDER — PHENYLEPHRINE HCL 10 MG/ML IJ SOLN
INTRAMUSCULAR | Status: DC | PRN
  Administered 2012-09-03: 80 ug via INTRAVENOUS
  Administered 2012-09-03: 40 ug via INTRAVENOUS
  Administered 2012-09-03: 80 ug via INTRAVENOUS
  Administered 2012-09-03: 40 ug via INTRAVENOUS
  Administered 2012-09-03: 80 ug via INTRAVENOUS
  Administered 2012-09-03: 40 ug via INTRAVENOUS

## 2012-09-03 MED ORDER — MORPHINE SULFATE 10 MG/ML IJ SOLN
INTRAMUSCULAR | Status: DC | PRN
  Administered 2012-09-03 (×3): 2 mg via INTRAVENOUS

## 2012-09-03 MED ORDER — EPHEDRINE SULFATE 50 MG/ML IJ SOLN
INTRAMUSCULAR | Status: DC | PRN
  Administered 2012-09-03: 10 mg via INTRAVENOUS

## 2012-09-03 MED ORDER — OXYCODONE HCL 5 MG PO TABS: 5.0000 mg | ORAL_TABLET | ORAL | Status: AC | PRN

## 2012-09-03 MED ORDER — SENNOSIDES-DOCUSATE SODIUM 8.6-50 MG PO TABS
1.0000 | ORAL_TABLET | Freq: Every evening | ORAL | Status: DC | PRN
  Filled 2012-09-03: qty 1

## 2012-09-03 MED ORDER — DEXTROSE-NACL 5-0.9 % IV SOLN
INTRAVENOUS | Status: DC
  Administered 2012-09-03: 13:00:00 via INTRAVENOUS
  Administered 2012-09-04: 20 mL via INTRAVENOUS

## 2012-09-03 MED ORDER — ONDANSETRON HCL 4 MG/2ML IJ SOLN
4.0000 mg | Freq: Four times a day (QID) | INTRAMUSCULAR | Status: DC | PRN
  Filled 2012-09-03: qty 2

## 2012-09-03 MED ORDER — SODIUM CHLORIDE 0.9 % IJ SOLN: 9.0000 mL | INTRAMUSCULAR | Status: DC | PRN

## 2012-09-03 MED ORDER — ACETAMINOPHEN 10 MG/ML IV SOLN
1000.0000 mg | Freq: Four times a day (QID) | INTRAVENOUS | Status: AC
  Administered 2012-09-03 – 2012-09-04 (×4): 1000 mg via INTRAVENOUS
  Filled 2012-09-03 (×3): qty 100

## 2012-09-03 MED ORDER — IPRATROPIUM BROMIDE 0.02 % IN SOLN: 500.0000 ug | Freq: Four times a day (QID) | RESPIRATORY_TRACT | Status: DC | PRN

## 2012-09-03 MED ORDER — DIPHENHYDRAMINE HCL 12.5 MG/5ML PO ELIX
12.5000 mg | ORAL_SOLUTION | Freq: Four times a day (QID) | ORAL | Status: DC | PRN
  Filled 2012-09-03: qty 5

## 2012-09-03 MED ORDER — FENTANYL CITRATE 0.05 MG/ML IJ SOLN
INTRAMUSCULAR | Status: AC
  Filled 2012-09-03: qty 2

## 2012-09-03 MED ORDER — NEOSTIGMINE METHYLSULFATE 1 MG/ML IJ SOLN
INTRAMUSCULAR | Status: DC | PRN
  Administered 2012-09-03: 4 mg via INTRAVENOUS

## 2012-09-03 MED ORDER — NALOXONE HCL 0.4 MG/ML IJ SOLN: 0.4000 mg | INTRAMUSCULAR | Status: DC | PRN

## 2012-09-03 MED ORDER — TRAMADOL HCL 50 MG PO TABS: 50.0000 mg | ORAL_TABLET | Freq: Four times a day (QID) | ORAL | Status: DC | PRN

## 2012-09-03 MED ORDER — PROPOFOL 10 MG/ML IV BOLUS
INTRAVENOUS | Status: DC | PRN
  Administered 2012-09-03: 200 mg via INTRAVENOUS

## 2012-09-03 MED ORDER — ONDANSETRON HCL 4 MG/2ML IJ SOLN
4.0000 mg | Freq: Four times a day (QID) | INTRAMUSCULAR | Status: DC | PRN
  Administered 2012-09-04: 4 mg via INTRAVENOUS

## 2012-09-03 MED ORDER — GLYCOPYRROLATE 0.2 MG/ML IJ SOLN
INTRAMUSCULAR | Status: DC | PRN
  Administered 2012-09-03: 0.6 mg via INTRAVENOUS

## 2012-09-03 MED ORDER — BISACODYL 5 MG PO TBEC
10.0000 mg | DELAYED_RELEASE_TABLET | Freq: Every day | ORAL | Status: DC
  Administered 2012-09-04 – 2012-09-05 (×2): 10 mg via ORAL
  Filled 2012-09-03 (×2): qty 2

## 2012-09-03 MED ORDER — IPRATROPIUM BROMIDE 0.03 % NA SOLN
2.0000 | Freq: Two times a day (BID) | NASAL | Status: DC
  Filled 2012-09-03 (×2): qty 30

## 2012-09-03 MED ORDER — CENTRUM SILVER PO TABS: 1.0000 | ORAL_TABLET | Freq: Every day | ORAL | Status: DC

## 2012-09-03 MED ORDER — PANTOPRAZOLE SODIUM 40 MG PO TBEC
40.0000 mg | DELAYED_RELEASE_TABLET | Freq: Every day | ORAL | Status: DC
  Administered 2012-09-04 – 2012-09-05 (×2): 40 mg via ORAL
  Filled 2012-09-03 (×2): qty 1

## 2012-09-03 MED ORDER — DEXTROSE 5 % IV SOLN
1.5000 g | Freq: Two times a day (BID) | INTRAVENOUS | Status: AC
  Administered 2012-09-03 – 2012-09-04 (×2): 1.5 g via INTRAVENOUS
  Filled 2012-09-03 (×2): qty 1.5

## 2012-09-03 MED ORDER — FENTANYL 10 MCG/ML IV SOLN
INTRAVENOUS | Status: DC
  Administered 2012-09-03: 20 ug via INTRAVENOUS
  Administered 2012-09-03: 10:00:00 via INTRAVENOUS
  Administered 2012-09-03: 60 ug via INTRAVENOUS
  Administered 2012-09-03: 50 ug via INTRAVENOUS
  Administered 2012-09-03: 10 ug via INTRAVENOUS
  Administered 2012-09-04: 96 ug via INTRAVENOUS
  Administered 2012-09-04: 40 ug via INTRAVENOUS
  Administered 2012-09-04: 210 ug via INTRAVENOUS
  Administered 2012-09-04: 50 ug via INTRAVENOUS
  Administered 2012-09-04: 30 ug via INTRAVENOUS
  Administered 2012-09-04: 10:00:00 via INTRAVENOUS
  Administered 2012-09-04: 50 ug via INTRAVENOUS
  Administered 2012-09-05: 60 ug via INTRAVENOUS
  Administered 2012-09-05: 30 ug via INTRAVENOUS
  Filled 2012-09-03 (×2): qty 50

## 2012-09-03 MED ORDER — ASPIRIN EC 81 MG PO TBEC
81.0000 mg | DELAYED_RELEASE_TABLET | Freq: Every day | ORAL | Status: DC
  Administered 2012-09-04 – 2012-09-05 (×2): 81 mg via ORAL
  Filled 2012-09-03 (×2): qty 1

## 2012-09-03 MED ORDER — ONDANSETRON HCL 4 MG/2ML IJ SOLN: 4.0000 mg | Freq: Once | INTRAMUSCULAR | Status: DC | PRN

## 2012-09-03 MED ORDER — SIMVASTATIN 40 MG PO TABS: 40.0000 mg | ORAL_TABLET | Freq: Every evening | ORAL | Status: DC

## 2012-09-03 MED ORDER — ARTIFICIAL TEARS OP OINT
TOPICAL_OINTMENT | OPHTHALMIC | Status: DC | PRN
  Administered 2012-09-03: 1 via OPHTHALMIC

## 2012-09-03 MED ORDER — GATIFLOXACIN 0.5 % OP SOLN
1.0000 [drp] | Freq: Four times a day (QID) | OPHTHALMIC | Status: DC
  Administered 2012-09-03 – 2012-09-05 (×8): 1 [drp] via OPHTHALMIC
  Filled 2012-09-03: qty 2.5

## 2012-09-03 MED ORDER — AMLODIPINE BESYLATE 5 MG PO TABS
5.0000 mg | ORAL_TABLET | Freq: Every day | ORAL | Status: DC
  Administered 2012-09-04 – 2012-09-05 (×2): 5 mg via ORAL
  Filled 2012-09-03 (×2): qty 1

## 2012-09-03 MED ORDER — PREDNISOLONE ACETATE 1 % OP SUSP
1.0000 [drp] | Freq: Three times a day (TID) | OPHTHALMIC | Status: DC
  Administered 2012-09-03 – 2012-09-05 (×6): 1 [drp] via OPHTHALMIC
  Filled 2012-09-03: qty 1

## 2012-09-03 MED ORDER — LACTATED RINGERS IV SOLN
INTRAVENOUS | Status: DC | PRN
  Administered 2012-09-03 (×2): via INTRAVENOUS

## 2012-09-03 MED ORDER — MIDAZOLAM HCL 2 MG/2ML IJ SOLN
INTRAMUSCULAR | Status: AC
  Filled 2012-09-03: qty 2

## 2012-09-03 MED ORDER — ONDANSETRON HCL 4 MG/2ML IJ SOLN
INTRAMUSCULAR | Status: DC | PRN
  Administered 2012-09-03: 4 mg via INTRAVENOUS

## 2012-09-03 MED ORDER — FENTANYL CITRATE 0.05 MG/ML IJ SOLN
INTRAMUSCULAR | Status: DC | PRN
  Administered 2012-09-03 (×2): 100 ug via INTRAVENOUS
  Administered 2012-09-03: 50 ug via INTRAVENOUS
  Administered 2012-09-03: 100 ug via INTRAVENOUS
  Administered 2012-09-03: 150 ug via INTRAVENOUS
  Administered 2012-09-03: 100 ug via INTRAVENOUS

## 2012-09-03 MED ORDER — 0.9 % SODIUM CHLORIDE (POUR BTL) OPTIME
TOPICAL | Status: DC | PRN
  Administered 2012-09-03: 2000 mL

## 2012-09-03 MED ORDER — TIOTROPIUM BROMIDE MONOHYDRATE 18 MCG IN CAPS
18.0000 ug | ORAL_CAPSULE | Freq: Every day | RESPIRATORY_TRACT | Status: DC
  Administered 2012-09-04 – 2012-09-05 (×2): 18 ug via RESPIRATORY_TRACT
  Filled 2012-09-03: qty 5

## 2012-09-03 MED ORDER — IPRATROPIUM BROMIDE 0.02 % IN SOLN: 500.0000 ug | RESPIRATORY_TRACT | Status: DC

## 2012-09-03 MED ORDER — HYDROMORPHONE HCL PF 1 MG/ML IJ SOLN
0.2500 mg | INTRAMUSCULAR | Status: DC | PRN
  Administered 2012-09-03: 0.5 mg via INTRAVENOUS

## 2012-09-03 SURGICAL SUPPLY — 62 items
APPLICATOR TIP COSEAL (VASCULAR PRODUCTS) IMPLANT
APPLICATOR TIP EXT COSEAL (VASCULAR PRODUCTS) IMPLANT
BLADE SURG 11 STRL SS (BLADE) ×3 IMPLANT
CANISTER SUCTION 2500CC (MISCELLANEOUS) ×3 IMPLANT
CATH KIT ON Q 5IN SLV (PAIN MANAGEMENT) IMPLANT
CATH THORACIC 28FR (CATHETERS) ×3 IMPLANT
CATH THORACIC 36FR (CATHETERS) IMPLANT
CATH THORACIC 36FR RT ANG (CATHETERS) IMPLANT
CLIP TI MEDIUM 6 (CLIP) ×3 IMPLANT
CLIP TI WIDE RED SMALL 24 (CLIP) ×3 IMPLANT
CLOTH BEACON ORANGE TIMEOUT ST (SAFETY) ×3 IMPLANT
CONT SPEC 4OZ CLIKSEAL STRL BL (MISCELLANEOUS) ×6 IMPLANT
DERMABOND ADHESIVE PROPEN (GAUZE/BANDAGES/DRESSINGS) ×1
DERMABOND ADVANCED .7 DNX6 (GAUZE/BANDAGES/DRESSINGS) ×2 IMPLANT
DRAPE LAPAROSCOPIC ABDOMINAL (DRAPES) ×3 IMPLANT
DRAPE WARM FLUID 44X44 (DRAPE) ×3 IMPLANT
DRILL BIT 7/64X5 (BIT) IMPLANT
ELECT BLADE 4.0 EZ CLEAN MEGAD (MISCELLANEOUS) ×3
ELECT REM PT RETURN 9FT ADLT (ELECTROSURGICAL) ×3
ELECTRODE BLDE 4.0 EZ CLN MEGD (MISCELLANEOUS) ×2 IMPLANT
ELECTRODE REM PT RTRN 9FT ADLT (ELECTROSURGICAL) ×2 IMPLANT
GLOVE BIO SURGEON STRL SZ 6.5 (GLOVE) ×9 IMPLANT
GOWN STRL NON-REIN LRG LVL3 (GOWN DISPOSABLE) ×9 IMPLANT
KIT BASIN OR (CUSTOM PROCEDURE TRAY) ×3 IMPLANT
KIT ROOM TURNOVER OR (KITS) ×3 IMPLANT
KIT SUCTION CATH 14FR (SUCTIONS) ×3 IMPLANT
NS IRRIG 1000ML POUR BTL (IV SOLUTION) ×6 IMPLANT
PACK CHEST (CUSTOM PROCEDURE TRAY) ×3 IMPLANT
PAD ARMBOARD 7.5X6 YLW CONV (MISCELLANEOUS) ×6 IMPLANT
POUCH SPECIMEN RETRIEVAL 10MM (ENDOMECHANICALS) ×3 IMPLANT
SCISSORS LAP 5X35 DISP (ENDOMECHANICALS) IMPLANT
SEALANT PROGEL (MISCELLANEOUS) IMPLANT
SEALANT SURG COSEAL 4ML (VASCULAR PRODUCTS) IMPLANT
SEALANT SURG COSEAL 8ML (VASCULAR PRODUCTS) IMPLANT
SOLUTION ANTI FOG 6CC (MISCELLANEOUS) ×3 IMPLANT
SPONGE GAUZE 4X4 12PLY (GAUZE/BANDAGES/DRESSINGS) ×3 IMPLANT
SUT PROLENE 3 0 SH DA (SUTURE) IMPLANT
SUT PROLENE 4 0 RB 1 (SUTURE)
SUT PROLENE 4-0 RB1 .5 CRCL 36 (SUTURE) IMPLANT
SUT SILK  1 MH (SUTURE) ×2
SUT SILK 1 MH (SUTURE) ×4 IMPLANT
SUT SILK 2 0SH CR/8 30 (SUTURE) IMPLANT
SUT SILK 3 0SH CR/8 30 (SUTURE) IMPLANT
SUT STEEL 1 (SUTURE) IMPLANT
SUT VIC AB 1 CTX 18 (SUTURE) ×3 IMPLANT
SUT VIC AB 1 CTX 36 (SUTURE) ×1
SUT VIC AB 1 CTX36XBRD ANBCTR (SUTURE) ×2 IMPLANT
SUT VIC AB 2-0 CTX 36 (SUTURE) ×3 IMPLANT
SUT VIC AB 2-0 UR6 27 (SUTURE) IMPLANT
SUT VIC AB 3-0 SH 8-18 (SUTURE) IMPLANT
SUT VIC AB 3-0 X1 27 (SUTURE) ×3 IMPLANT
SUT VICRYL 2 TP 1 (SUTURE) IMPLANT
SWAB COLLECTION DEVICE MRSA (MISCELLANEOUS) IMPLANT
SYSTEM SAHARA CHEST DRAIN ATS (WOUND CARE) ×3 IMPLANT
TIP APPLICATOR SPRAY EXTEND 16 (VASCULAR PRODUCTS) IMPLANT
TOWEL OR 17X24 6PK STRL BLUE (TOWEL DISPOSABLE) ×6 IMPLANT
TOWEL OR 17X26 10 PK STRL BLUE (TOWEL DISPOSABLE) ×6 IMPLANT
TRAP SPECIMEN MUCOUS 40CC (MISCELLANEOUS) IMPLANT
TRAY FOLEY CATH 14FRSI W/METER (CATHETERS) ×3 IMPLANT
TUBE ANAEROBIC SPECIMEN COL (MISCELLANEOUS) IMPLANT
TUNNELER SHEATH ON-Q 11GX8 (MISCELLANEOUS) IMPLANT
WATER STERILE IRR 1000ML POUR (IV SOLUTION) ×3 IMPLANT

## 2012-09-03 NOTE — Anesthesia Procedure Notes (Signed)
Procedure Name: Intubation Date/Time: 09/03/2012 7:39 AM Performed by: Jefm Miles E Pre-anesthesia Checklist: Patient identified, Timeout performed, Emergency Drugs available, Suction available and Patient being monitored Patient Re-evaluated:Patient Re-evaluated prior to inductionOxygen Delivery Method: Circle system utilized Preoxygenation: Pre-oxygenation with 100% oxygen Intubation Type: IV induction Ventilation: Mask ventilation without difficulty Laryngoscope Size: Mac and 3 Grade View: Grade I Tube type: Oral Endobronchial tube: Right, Double lumen EBT and EBT position confirmed by fiberoptic bronchoscope and 39 Fr Number of attempts: 1 Airway Equipment and Method: Stylet Placement Confirmation: ETT inserted through vocal cords under direct vision,  positive ETCO2 and breath sounds checked- equal and bilateral Secured at: 29 cm Tube secured with: Tape Dental Injury: Teeth and Oropharynx as per pre-operative assessment

## 2012-09-03 NOTE — Brief Op Note (Addendum)
09/03/2012  9:46 AM  PATIENT:  Roseanne Reno  72 y.o. male  PRE-OPERATIVE DIAGNOSIS:  Anterior Mediastinal Mass  POST-OPERATIVE DIAGNOSIS:   Anterior Mediastinal Mass  PROCEDURE: FLEXIBLE BRONCHOSCOPY, LEFT VIDEO ASSISTED THORACOSCOPY (VATS) and RESECTION of ANTERIOR MEDIASTINAL MASS   SURGEON:  Surgeon(s) and Role:    * Delight Ovens, MD - Primary  PHYSICIAN ASSISTANT: Doree Fudge PA-C               ANESTHESIA:   general  EBL:  Total I/O In: -  Out: 275 [Urine:275] Blood loss minimal  BLOOD ADMINISTERED:none  DRAINS: One 28 French Chest Tube(s) in the left pleural space    SPECIMEN:  Source of Specimen:  Left Anterior Mediastinal Mass  DISPOSITION OF SPECIMEN:  PATHOLOGY  COUNTS CORRECT:  YES  DICTATION: .Dragon Dictation  PLAN OF CARE: Admit to inpatient   PATIENT DISPOSITION:  PACU - hemodynamically stable.   Delay start of Pharmacological VTE agent (>24hrs) due to surgical blood loss or risk of bleeding: yes

## 2012-09-03 NOTE — Anesthesia Preprocedure Evaluation (Addendum)
Anesthesia Evaluation  Patient identified by MRN, date of birth, ID band Patient awake    Reviewed: Allergy & Precautions, H&P , NPO status , Patient's Chart, lab work & pertinent test results  Airway Mallampati: I TM Distance: >3 FB Neck ROM: full    Dental  (+) Teeth Intact and Dental Advisory Given   Pulmonary COPDformer smoker,          Cardiovascular hypertension, Rhythm:regular Rate:Normal     Neuro/Psych    GI/Hepatic GERD-  ,  Endo/Other    Renal/GU      Musculoskeletal   Abdominal   Peds  Hematology   Anesthesia Other Findings Mediastinal mass  Reproductive/Obstetrics                          Anesthesia Physical Anesthesia Plan  ASA: II  Anesthesia Plan: General   Post-op Pain Management:    Induction: Intravenous  Airway Management Planned: Oral ETT and Double Lumen EBT  Additional Equipment: Arterial line and CVP  Intra-op Plan:   Post-operative Plan: Possible Post-op intubation/ventilation  Informed Consent: I have reviewed the patients History and Physical, chart, labs and discussed the procedure including the risks, benefits and alternatives for the proposed anesthesia with the patient or authorized representative who has indicated his/her understanding and acceptance.     Plan Discussed with: CRNA, Anesthesiologist and Surgeon  Anesthesia Plan Comments:         Anesthesia Quick Evaluation

## 2012-09-03 NOTE — Progress Notes (Signed)
Comfortable s/p resection of anterior mediastinal mass  BP 106/69  Pulse 73  Temp(Src) 97.7 F (36.5 C) (Oral)  Resp 20  Ht 5\' 9"  (1.753 m)  Wt 188 lb (85.276 kg)  BMI 27.75 kg/m2  SpO2 96%   Intake/Output Summary (Last 24 hours) at 09/03/12 2042 Last data filed at 09/03/12 1900  Gross per 24 hour  Intake   2808 ml  Output   1180 ml  Net   1628 ml    Doing well early postop

## 2012-09-03 NOTE — H&P (Signed)
  [address]       [place] [address]             [phone]        Manvir Jagiello [place] Medical Record #[id number] Date of Birth: 05/30/1941  Referring:Ramaswamy, Murali, MD Primary Care: PATERSON,DANIEL G, MD  Chief Complaint:    Mediastinal Mass  History of Present Illness:    Patient is a 72-year-old male with a previous diagnosis of COPD. He notes that approximately one half years ago he had some vague shortness of breath with exertion. Screening pulmonary function studies suggest the an FEV1  2.1 70%. He was started on Spiriva she notes has helped some. He saw an emergency room physician in [place] who suggested that he see a pulmonologist when he returned home. He was seen in the bar pulmonology by Dr. Ramaswamy. Because of his previous history of smoking a pack a day for approximately 25 years and stopping 10/02/1982,  a screening CT scan was ordered. 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 The patient denies any chest pain, has had no fever chills or night sweats, has no muscle weakness or history of myasthenia.   He does see lumbar cardiology for episodes of dizziness several years ago, a stress test was reported as normal at that time. He's had no known history of cardiac events denies consistent exertional related chest pain. His CT scan does reveal evidence of calcification of his coronary arteries, including calcium in the left main.  The patient returned  to the office today after  Pulmonary function studiesand a PET scan was completed.   In addition I contacted his cardiologist who has  changed his statin but from his recent visit did not feel any further workup was indicated prior to thoracic surgery. The patient denies any angina evidence of congestive heart failure or any previous history of myocardial infarction.   Current Activity/ Functional Status:  Patient is  independent with mobility/ambulation, transfers, ADL's, IADL's.  Zubrod Score: At the time of surgery this patient's most appropriate activity status/level should be described as: [] Normal activity, no symptoms [x] Symptoms, fully ambulatory [] Symptoms, in bed less than or equal to 50% of the time [] Symptoms, in bed greater than 50% of the time but less than 100% [] Bedridden [] Moribund   Past Medical History  Diagnosis Date  . Essential hypertension, benign   . Hyperlipidemia   . History of colonic polyps   . Lightheadedness     workup for this about 1.5 years ago included a 3 week event monitor and an echo, which were both unremarkable. Of note, patient was no longer having lightheaded spells when he wore the monitor  . GERD (gastroesophageal reflux disease)   . Diverticulosis   . COPD (chronic obstructive pulmonary disease)     Past Surgical History  Procedure Laterality Date  . Cataract extraction    . Eye surgery Right     muscle     Family History  Problem Relation Age of Onset  . Coronary artery disease Neg Hx   . Emphysema Father   . Cancer Mother    patient's father was a military flier died of emphysema at age 82 nonsmoker His mother died at age 82 with lung cancer was a long-term smoker He has 3 sons who are all healthy Half sister with history of breast cancer and a half brother   who died with diabetes related disease 1 full brother with history of prostate cancer  History   Social History  . Marital Status: Married    Spouse Name: N/A    Number of Children: N/A  . Years of Education: N/A   Occupational History  .  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 Smoking status: Former Smoker -- 1.00 packs/day for 25 years    Types: Cigarettes    Quit date: 07/03/1982  . Smokeless tobacco:  none   . Alcohol Use: 1.0 oz/week    2 drink(s) per week  . Drug Use:  No    History  Smoking status  . Former Smoker -- 1.00 packs/day for 25 years  . Types: Cigarettes  . Quit date: 07/03/1982  Smokeless tobacco  . Not on file    History  Alcohol Use  . 7.0 oz/week  . 14 drink(s) per week     No Known Allergies  Review of Systems:     Cardiac Review of Systems: Y or N  Chest Pain [  n  ]  Resting SOB [  n ] Exertional SOB  [y  ]  Orthopnea [n  ]   Pedal Edema [ n  ]    Palpitations [ n ] Syncope  [  ]n   Presyncope [n   ]  General Review of Systems: [Y] = yes [  ]=no Constitional: recent weight change [n  ]; anorexia [  ]; fatigue [n  ]; nausea [n  ]; night sweats [  n]; fever [ n ]; or chills [n  ];                                                                                                                                          Dental: poor dentition[  ]; Last Dentist visit:   Eye : blurred vision [  ]; diplopia [   ]; vision changes [  ];  Amaurosis fugax[  ]; Resp: cough [  ];  wheezing[  ];  hemoptysis[  ]; shortness of breath[  ]; paroxysmal nocturnal dyspnea[  ]; dyspnea on exertion[  ]; or orthopnea[  ];  GI:  gallstones[  ], vomiting[  ];  dysphagia[  ]; melena[  ];  hematochezia [n  ]; heartburn[  ];   Hx of  Colonoscopy[ y ]; GU: kidney stones [  ]; hematuria[  ];   dysuria [  ];  nocturia[  ];  history of     obstruction [  ]; urinary frequency [  ]             Skin: rash, swelling[  ];, hair loss[  ];  peripheral edema[  ];  or itching[  ]; Musculosketetal: myalgias[  ];    joint swelling[  ];  joint erythema[  ];  joint pain[  ];  back pain[  ];  Heme/Lymph: bruising[  ];  bleeding[  ];  anemia[  ];  Neuro: TIA[  ];  headaches[  ];  stroke[  ];  vertigo[  ];  seizures[  ];   paresthesias[  ];  difficulty walking[  ];  Psych:depression[  ]; anxiety[  ];  Endocrine: diabetes[ n ];  thyroid dysfunction[ n ];  Immunizations: Flu [y  ]; Pneumococcal[y  ];  Other:  Physical Exam: BP 154/76  Pulse 72  Temp(Src) 97.7 F (36.5 C)  (Oral)  Resp 18  SpO2 96%  General appearance: alert, cooperative, appears stated age and no distress Neurologic: intact Heart: regular rate and rhythm, S1, S2 normal, no murmur, click, rub or gallop Lungs: clear to auscultation bilaterally Abdomen: soft, non-tender; bowel sounds normal; no masses,  no organomegaly Extremities: extremities normal, atraumatic, no cyanosis or edema and Homans sign is negative, no sign of DVT I do not appreciate any cervical or supraclavicular adenopathy no axillary adenopathy, he has no carotid bruits, has full and equal pulses bilaterally   Diagnostic Studies & Laboratory data:     Recent Radiology Findings:  Ct Chest Low Dose Pilot W/o Cm  08/20/2012  *RADIOLOGY REPORT*  Clinical Data: 72-year-old male with a prior history of smoking. Lung cancer screening.  CT CHEST LOW DOSE PILOT WITHOUT CONTRAST  Technique: Multidetector CT imaging of the chest using the standard low-dose protocol without administration of intravenous contrast.  Comparison: No priors.  Findings:  Mediastinum: In the anterior mediastinum there is a 5.2 x 4.0 cm smoothly marginated ovoid shaped lesion that has heterogeneous internal attenuation.  There is a well-defined fat plane between this lesion and the adjacent proximal aortic arch.  No additional mediastinal or hilar lymph nodes or masses are otherwise noted. Heart size is normal. There is no significant pericardial fluid, thickening or pericardial calcification. There is atherosclerosis of the thoracic aorta, the great vessels of the mediastinum and the coronary arteries, including calcified atherosclerotic plaque in the left main coronary arteries. Esophagus is unremarkable in appearance.  Lungs/Pleura: There is a 2 mm nodule in the anterior aspect of the right upper lobe (image 101 of series 5), and a 3 mm subpleural nodule in the periphery of the left upper lobe (image 99 of series 5).  No other larger more suspicious appearing pulmonary  nodules or masses are otherwise noted.  There is a background of mild centrilobular emphysema and mild diffuse bronchial wall thickening. No acute consolidative airspace disease.  No pleural effusions. Linear opacities in the lower lobes of the lungs bilaterally may reflect areas of subsegmental atelectasis or scarring.  Upper Abdomen: Unremarkable.  Musculoskeletal: There are no aggressive appearing lytic or blastic lesions noted in the visualized portions of the skeleton.  IMPRESSION: 1.  Small 2 and 3 mm nodules in the right upper lobe and left upper lobe respectively, as discussed above. Annual low dose CT examination is recommended for the next 3 years and until the patient is 72 years of age.  This recommendation follows the NCCN Guidelines for Lung Cancer Screening. 2.  5.2 x 4.0 cm smoothly marginated anterior mediastinal mass. This has a heterogeneous internal attenuation suggesting internal areas of cystic change or necrosis.  Differential considerations favor an enlarged necrotic lymph node, potentially in the setting of unrecognized lymphoma.  Other considerations for an anterior mediastinal mass with this appearance include a germ cell neoplasm,   thymic lesion, or lesion of thyroid origin (however, a thyroidal lesion is not favored as this lesion is clearly separate from the thyroid gland). 3. Atherosclerosis, including left main coronary artery disease. Assessment for potential risk factor modification, dietary therapy or pharmacologic therapy may be warranted, if clinically indicated. 4.  Mild diffuse bronchial wall thickening with mild centrilobular and paraseptal emphysema.  These results will be called to the ordering clinician or representative by the Radiologist Assistant, and communication documented in the PACS Dashboard.   Original Report Authenticated By: Daniel Entrikin, M.D.     Nm Pet Image Initial (pi) Skull Base To Thigh  08/29/2012  *RADIOLOGY REPORT*  Clinical Data: Initial treatment  strategy for anterior mediastinal mass.  NUCLEAR MEDICINE PET SKULL BASE TO THIGH  Fasting Blood Glucose:  106  Technique:  19.8  mCi F-18 FDG was injected intravenously. CT data was obtained and used for attenuation correction and anatomic localization only.  (This was not acquired as a diagnostic CT examination.) Additional exam technical data entered on technologist worksheet.  Comparison:  CT thorax 08/19/2012  Findings:  Neck: No hypermetabolic lymph nodes in the neck.  Chest:  Within the anterior mediastinum there is a rounded 5.3 x 3.9 cm mass which is heterogeneous in density on noncontrast CT. The portion of the lesion which is more dense has intense metabolic activity with SUV max = 8.2.  No additional hypermetabolic masses or nodes in the mediastinum.  No hypermetabolic pulmonary nodules.  Abdomen/Pelvis:  No abnormal hypermetabolic activity within the liver, pancreas, adrenal glands, or spleen.  No hypermetabolic lymph nodes in the abdomen or pelvis.  Skeleton:  No focal hypermetabolic activity to suggest skeletal metastasis.  IMPRESSION:  1.  Hypermetabolic rounded anterior mediastinal mass is consistent with benign or malignant neoplasm.  Recommend biopsy versus surgical resection. 2.  No evidence of local nodal metastasis or distant metastasis.   Original Report Authenticated By: Stewart Edmunds, M.D.    Recent Lab Findings: Lab Results  Component Value Date   WBC 7.7 09/02/2012   HGB 15.1 09/02/2012   HCT 43.1 09/02/2012   PLT 252 09/02/2012   GLUCOSE 101* 09/02/2012   CHOL 186 10/24/2010   TRIG 59.0 10/24/2010   HDL 67.80 10/24/2010   LDLCALC 106* 10/24/2010   ALT 21 09/02/2012   AST 21 09/02/2012   NA 137 09/02/2012   K 4.3 09/02/2012   CL 102 09/02/2012   CREATININE 1.21 09/02/2012   BUN 18 09/02/2012   CO2 25 09/02/2012   INR 0.95 09/02/2012   PFT's  FEV1=2.4 76%  DLCO 27.97  90%   Assessment / Plan:   #1 new diagnosis of 5 cm anterior mediastinal mass, hypermetabolic without any evidence of other  hypermetabolic disease  #2 history of hypertension #3 history of COPD   I reviewed the radiographic findings with the patient and discussed the possible diagnosis of a 5 cm anterior mediastinal mass. 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  I discussed with patient proceeding with left chest approach to resection with a anterior thoracotomy incision an additional port sites.  He's recently seen Dr. D McLean for cardiology who did not  think he needed any further evaluation for his cardiac status other than the stress test several years ago.  The goals risks and alternatives of the planned surgical procedure Resection of mediastinal   mass  have been discussed with the patient in detail. The risks of the procedure including death, infection, stroke, myocardial infarction, bleeding, blood transfusion have all been discussed specifically.  I have quoted Silvestre Brazeau a 2 % of perioperative mortality and a complication rate as high as 15 %. The patient's questions have been answered.Arling Datta is willing  to proceed with the planned procedure.   [name] [name] MD Beeper [phone] Office [phone] 09/03/2012 7:18 AM        

## 2012-09-03 NOTE — Progress Notes (Signed)
[address]       [place] [address]             [phone]        Michael [name] [facility] Medical Record #[id number] Date of Birth: July 24, 1940  Referring:Ramaswamy, Carmin Muskrat, MD Primary Care: Garlan Fillers, MD  Chief Complaint:    Mediastinal Mass  History of Present Illness:    Patient is a 72 year old male with a previous diagnosis of COPD. He notes that approximately one half years ago he had some vague shortness of breath with exertion. Screening pulmonary function studies suggest the an FEV1  2.1 67%. He was started on Spiriva she notes has helped some. He saw an emergency room physician in [place] who suggested that he see a pulmonologist when he returned home. He was seen in the bar pulmonology by Dr. Marchelle Gearing. Because of his previous history of smoking a pack a day for approximately 25 years and stopping 10/02/1982,  a screening CT scan was ordered. 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 The patient denies any chest pain, has had no fever chills or night sweats, has no muscle weakness or history of myasthenia.   He does see lumbar cardiology for episodes of dizziness several years ago, a stress test was reported as normal at that time. He's had no known history of cardiac events denies consistent exertional related chest pain. His CT scan does reveal evidence of calcification of his coronary arteries, including calcium in the left main.  The patient returned  to the office today after  Pulmonary function studiesand a PET scan was completed.   In addition I contacted his cardiologist who has  changed his statin but from his recent visit did not feel any further workup was indicated prior to thoracic surgery. The patient denies any angina evidence of congestive heart failure or any previous history of myocardial infarction.   Current Activity/ Functional Status:  Patient is  independent with mobility/ambulation, transfers, ADL's, IADL's.  Zubrod Score: At the time of surgery this patient's most appropriate activity status/level should be described as: []  Normal activity, no symptoms [x]  Symptoms, fully ambulatory []  Symptoms, in bed less than or equal to 50% of the time []  Symptoms, in bed greater than 50% of the time but less than 100% []  Bedridden []  Moribund   Past Medical History  Diagnosis Date  . Essential hypertension, benign   . Hyperlipidemia   . History of colonic polyps   . Lightheadedness     workup for this about 1.5 years ago included a 3 week event monitor and an echo, which were both unremarkable. Of note, patient was no longer having lightheaded spells when he wore the monitor  . GERD (gastroesophageal reflux disease)   . Diverticulosis   . COPD (chronic obstructive pulmonary disease)     Past Surgical History  Procedure Laterality Date  . Cataract extraction    . Eye surgery Right     muscle     Family History  Problem Relation Age of Onset  . Coronary artery disease Neg Hx   . Emphysema Father   . Cancer Mother    patient's father was a Catering manager died of emphysema at age 38 nonsmoker His mother died at age 67 with lung cancer was a long-term smoker He has 3 sons who are all healthy Half sister with history of breast cancer and a half brother  who died with diabetes related disease 1 full brother with history of prostate cancer  History   Social History  . Marital Status: Married    Spouse Name: N/A    Number of Children: N/A  . Years of Education: N/A   Occupational History  .  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 Smoking status: Former Smoker -- 1.00 packs/day for 25 years    Types: Cigarettes    Quit date: 07/03/1982  . Smokeless tobacco:  none   . Alcohol Use: 1.0 oz/week    2 drink(s) per week  . Drug Use:  No    History  Smoking status  . Former Smoker -- 1.00 packs/day for 25 years  . Types: Cigarettes  . Quit date: 07/03/1982  Smokeless tobacco  . Not on file    History  Alcohol Use  . 7.0 oz/week  . 14 drink(s) per week     No Known Allergies  Review of Systems:     Cardiac Review of Systems: Y or N  Chest Pain [  n  ]  Resting SOB [  n ] Exertional SOB  Cove.Etienne  ]  Orthopnea Milo.Brash  ]   Pedal Edema [ n  ]    Palpitations [ n ] Syncope  [  ]n   Presyncope [n   ]  General Review of Systems: [Y] = yes [  ]=no Constitional: recent weight change [n  ]; anorexia [  ]; fatigue [n  ]; nausea [n  ]; night sweats [  n]; fever [ n ]; or chills [n  ];                                                                                                                                          Dental: poor dentition[  ]; Last Dentist visit:   Eye : blurred vision [  ]; diplopia [   ]; vision changes [  ];  Amaurosis fugax[  ]; Resp: cough [  ];  wheezing[  ];  hemoptysis[  ]; shortness of breath[  ]; paroxysmal nocturnal dyspnea[  ]; dyspnea on exertion[  ]; or orthopnea[  ];  GI:  gallstones[  ], vomiting[  ];  dysphagia[  ]; melena[  ];  hematochezia [n  ]; heartburn[  ];   Hx of  Colonoscopy[ y ]; GU: kidney stones [  ]; hematuria[  ];   dysuria [  ];  nocturia[  ];  history of     obstruction [  ]; urinary frequency [  ]             Skin: rash, swelling[  ];, hair loss[  ];  peripheral edema[  ];  or itching[  ]; Musculosketetal: myalgias[  ];  joint swelling[  ];  joint erythema[  ];  joint pain[  ];  back pain[  ];  Heme/Lymph: bruising[  ];  bleeding[  ];  anemia[  ];  Neuro: TIA[  ];  headaches[  ];  stroke[  ];  vertigo[  ];  seizures[  ];   paresthesias[  ];  difficulty walking[  ];  Psych:depression[  ]; anxiety[  ];  Endocrine: diabetes[ n ];  thyroid dysfunction[ n ];  Immunizations: Flu Cove.Etienne  ]; Pneumococcal[y  ];  Other:  Physical Exam: BP 154/76  Pulse 72  Temp(Src) 97.7 F (36.5 C)  (Oral)  Resp 18  SpO2 96%  General appearance: alert, cooperative, appears stated age and no distress Neurologic: intact Heart: regular rate and rhythm, S1, S2 normal, no murmur, click, rub or gallop Lungs: clear to auscultation bilaterally Abdomen: soft, non-tender; bowel sounds normal; no masses,  no organomegaly Extremities: extremities normal, atraumatic, no cyanosis or edema and Homans sign is negative, no sign of DVT I do not appreciate any cervical or supraclavicular adenopathy no axillary adenopathy, he has no carotid bruits, has full and equal pulses bilaterally   Diagnostic Studies & Laboratory data:     Recent Radiology Findings:  Ct Chest Low Dose Pilot W/o Cm  08/20/2012  *RADIOLOGY REPORT*  Clinical Data: 72 year old male with a prior history of smoking. Lung cancer screening.  CT CHEST LOW DOSE PILOT WITHOUT CONTRAST  Technique: Multidetector CT imaging of the chest using the standard low-dose protocol without administration of intravenous contrast.  Comparison: No priors.  Findings:  Mediastinum: In the anterior mediastinum there is a 5.2 x 4.0 cm smoothly marginated ovoid shaped lesion that has heterogeneous internal attenuation.  There is a well-defined fat plane between this lesion and the adjacent proximal aortic Michael.  No additional mediastinal or hilar lymph nodes or masses are otherwise noted. Heart size is normal. There is no significant pericardial fluid, thickening or pericardial calcification. There is atherosclerosis of the thoracic aorta, the great vessels of the mediastinum and the coronary arteries, including calcified atherosclerotic plaque in the left main coronary arteries. Esophagus is unremarkable in appearance.  Lungs/Pleura: There is a 2 mm nodule in the anterior aspect of the right upper lobe (image 101 of series 5), and a 3 mm subpleural nodule in the periphery of the left upper lobe (image 99 of series 5).  No other larger more suspicious appearing pulmonary  nodules or masses are otherwise noted.  There is a background of mild centrilobular emphysema and mild diffuse bronchial wall thickening. No acute consolidative airspace disease.  No pleural effusions. Linear opacities in the lower lobes of the lungs bilaterally may reflect areas of subsegmental atelectasis or scarring.  Upper Abdomen: Unremarkable.  Musculoskeletal: There are no aggressive appearing lytic or blastic lesions noted in the visualized portions of the skeleton.  IMPRESSION: 1.  Small 2 and 3 mm nodules in the right upper lobe and left upper lobe respectively, as discussed above. Annual low dose CT examination is recommended for the next 3 years and until the patient is 72 years of age.  This recommendation follows the NCCN Guidelines for Lung Cancer Screening. 2.  5.2 x 4.0 cm smoothly marginated anterior mediastinal mass. This has a heterogeneous internal attenuation suggesting internal areas of cystic change or necrosis.  Differential considerations favor an enlarged necrotic lymph node, potentially in the setting of unrecognized lymphoma.  Other considerations for an anterior mediastinal mass with this appearance include a germ cell neoplasm,  thymic lesion, or lesion of thyroid origin (however, a thyroidal lesion is not favored as this lesion is clearly separate from the thyroid gland). 3. Atherosclerosis, including left main coronary artery disease. Assessment for potential risk factor modification, dietary therapy or pharmacologic therapy may be warranted, if clinically indicated. 4.  Mild diffuse bronchial wall thickening with mild centrilobular and paraseptal emphysema.  These results will be called to the ordering clinician or representative by the Radiologist Assistant, and communication documented in the PACS Dashboard.   Original Report Authenticated By: Trudie Reed, M.D.     Nm Pet Image Initial (pi) Skull Base To Thigh  08/29/2012  *RADIOLOGY REPORT*  Clinical Data: Initial treatment  strategy for anterior mediastinal mass.  NUCLEAR MEDICINE PET SKULL BASE TO THIGH  Fasting Blood Glucose:  106  Technique:  19.8  mCi F-18 FDG was injected intravenously. CT data was obtained and used for attenuation correction and anatomic localization only.  (This was not acquired as a diagnostic CT examination.) Additional exam technical data entered on technologist worksheet.  Comparison:  CT thorax 08/19/2012  Findings:  Neck: No hypermetabolic lymph nodes in the neck.  Chest:  Within the anterior mediastinum there is a rounded 5.3 x 3.9 cm mass which is heterogeneous in density on noncontrast CT. The portion of the lesion which is more dense has intense metabolic activity with SUV max = 8.2.  No additional hypermetabolic masses or nodes in the mediastinum.  No hypermetabolic pulmonary nodules.  Abdomen/Pelvis:  No abnormal hypermetabolic activity within the liver, pancreas, adrenal glands, or spleen.  No hypermetabolic lymph nodes in the abdomen or pelvis.  Skeleton:  No focal hypermetabolic activity to suggest skeletal metastasis.  IMPRESSION:  1.  Hypermetabolic rounded anterior mediastinal mass is consistent with benign or malignant neoplasm.  Recommend biopsy versus surgical resection. 2.  No evidence of local nodal metastasis or distant metastasis.   Original Report Authenticated By: Genevive Bi, M.D.    Recent Lab Findings: Lab Results  Component Value Date   WBC 7.7 09/02/2012   HGB 15.1 09/02/2012   HCT 43.1 09/02/2012   PLT 252 09/02/2012   GLUCOSE 101* 09/02/2012   CHOL 186 10/24/2010   TRIG 59.0 10/24/2010   HDL 67.80 10/24/2010   LDLCALC 106* 10/24/2010   ALT 21 09/02/2012   AST 21 09/02/2012   NA 137 09/02/2012   K 4.3 09/02/2012   CL 102 09/02/2012   CREATININE 1.21 09/02/2012   BUN 18 09/02/2012   CO2 25 09/02/2012   INR 0.95 09/02/2012   PFT's  FEV1=2.4 76%  DLCO 27.97  90%   Assessment / Plan:   #1 new diagnosis of 5 cm anterior mediastinal mass, hypermetabolic without any evidence of other  hypermetabolic disease  #2 history of hypertension #3 history of COPD   I reviewed the radiographic findings with the patient and discussed the possible diagnosis of a 5 cm anterior mediastinal mass. 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  I discussed with patient proceeding with left chest approach to resection with a anterior thoracotomy incision an additional port sites.  He's recently seen Dr. Golden Circle for cardiology who did not  think he needed any further evaluation for his cardiac status other than the stress test several years ago.  The goals risks and alternatives of the planned surgical procedure Resection of mediastinal  mass  have been discussed with the patient in detail. The risks of the procedure including death, infection, stroke, myocardial infarction, bleeding, blood transfusion have all been discussed specifically.  I have quoted Michael [name] a 2 % of perioperative mortality and a complication rate as high as 15 %. The patient's questions have been answered.Michael [name] is willing  to proceed with the planned procedure.   Delight Ovens MD Beeper [phone] Office [phone] 09/03/2012 7:18 AM

## 2012-09-03 NOTE — Anesthesia Postprocedure Evaluation (Signed)
  Anesthesia Post-op Note  Patient: Michael [name]  Procedure(s) Performed: Procedure(s): VIDEO ASSISTED THORACOSCOPY (VATS) ,Resection Anterior Mediastinal Mass (Left) FLEXIBLE BRONCHOSCOPY (N/A)  Patient Location: PACU  Anesthesia Type:General  Level of Consciousness: awake, oriented, sedated and patient cooperative  Airway and Oxygen Therapy: Patient Spontanous Breathing and Patient connected to nasal cannula oxygen  Post-op Pain: moderate  Post-op Assessment: Post-op Vital signs reviewed, Patient's Cardiovascular Status Stable, Respiratory Function Stable, Patent Airway, No signs of Nausea or vomiting and Pain level controlled  Post-op Vital Signs: stable  Complications: No apparent anesthesia complications

## 2012-09-03 NOTE — Transfer of Care (Signed)
Immediate Anesthesia Transfer of Care Note  Patient: Michael [name]  Procedure(s) Performed: Procedure(s): VIDEO ASSISTED THORACOSCOPY (VATS) ,Resection Anterior Mediastinal Mass (Left) FLEXIBLE BRONCHOSCOPY (N/A)  Patient Location: PACU  Anesthesia Type:General  Level of Consciousness: oriented, patient cooperative, lethargic and responds to stimulation  Airway & Oxygen Therapy: Patient Spontanous Breathing and Patient connected to nasal cannula oxygen  Post-op Assessment: Report given to PACU RN  Post vital signs: Reviewed and stable  Complications: No apparent anesthesia complications

## 2012-09-03 NOTE — Preoperative (Signed)
Beta Blockers   Reason not to administer Beta Blockers:Not Applicable 

## 2012-09-04 ENCOUNTER — Encounter: Payer: Self-pay | Admitting: Cardiothoracic Surgery

## 2012-09-04 ENCOUNTER — Inpatient Hospital Stay (HOSPITAL_COMMUNITY): Payer: Medicare Other

## 2012-09-04 ENCOUNTER — Encounter (HOSPITAL_COMMUNITY): Payer: Self-pay | Admitting: Cardiothoracic Surgery

## 2012-09-04 DIAGNOSIS — J90 Pleural effusion, not elsewhere classified: Secondary | ICD-10-CM | POA: Diagnosis not present

## 2012-09-04 DIAGNOSIS — J9819 Other pulmonary collapse: Secondary | ICD-10-CM | POA: Diagnosis not present

## 2012-09-04 LAB — CBC
Hemoglobin: 13 g/dL (ref 13.0–17.0)
MCHC: 35.6 g/dL (ref 30.0–36.0)
Platelets: 193 10*3/uL (ref 150–400)
RDW: 13.4 % (ref 11.5–15.5)

## 2012-09-04 LAB — POCT I-STAT 3, ART BLOOD GAS (G3+)
Acid-base deficit: 2 mmol/L (ref 0.0–2.0)
Bicarbonate: 22.6 mEq/L (ref 20.0–24.0)
O2 Saturation: 89 %
Patient temperature: 36.4
TCO2: 24 mmol/L (ref 0–100)
pCO2 arterial: 38.1 mmHg (ref 35.0–45.0)
pH, Arterial: 7.378 (ref 7.350–7.450)
pO2, Arterial: 55 mmHg — ABNORMAL LOW (ref 80.0–100.0)

## 2012-09-04 LAB — BASIC METABOLIC PANEL
GFR calc Af Amer: 81 mL/min — ABNORMAL LOW (ref >90)
GFR calc non Af Amer: 70 mL/min — ABNORMAL LOW (ref >90)
Potassium: 4 mEq/L (ref 3.5–5.1)
Sodium: 138 mEq/L (ref 135–145)

## 2012-09-04 MED ORDER — ENOXAPARIN SODIUM 30 MG/0.3ML ~~LOC~~ SOLN
30.0000 mg | SUBCUTANEOUS | Status: DC
  Administered 2012-09-04 – 2012-09-05 (×2): 30 mg via SUBCUTANEOUS
  Filled 2012-09-04 (×2): qty 0.3

## 2012-09-04 NOTE — Progress Notes (Signed)
Patient ID: Michael [name], male   DOB: 07/27/1940, 72 y.o.   MRN: [id number] TCTS DAILY PROGRESS NOTE                   [address]            [place] [address]          [phone]      1 Day Post-Op Procedure(s) (LRB): VIDEO ASSISTED THORACOSCOPY (VATS) ,Resection Anterior Mediastinal Mass (Left) FLEXIBLE BRONCHOSCOPY (N/A)  Total Length of Stay:  LOS: 1 day   Subjective: Stable good pain control  Objective: Vital signs in last 24 hours: Temp:  [97.4 F (36.3 C)-97.7 F (36.5 C)] 97.5 F (36.4 C) (03/05 0809) Pulse Rate:  [65-84] 72 (03/05 0700) Cardiac Rhythm:  [-] Normal sinus rhythm (03/04 2000) Resp:  [7-22] 19 (03/05 0800) BP: (91-137)/(45-87) 110/65 mmHg (03/05 0700) SpO2:  [92 %-99 %] 94 % (03/05 0800) Arterial Line BP: (69-148)/(52-72) 96/72 mmHg (03/05 0600) Weight:  [188 lb (85.276 kg)] 188 lb (85.276 kg) (03/04 1230)  Filed Weights   09/03/12 1230  Weight: 188 lb (85.276 kg)    Weight change:    Hemodynamic parameters for last 24 hours:    Intake/Output from previous day: 03/04 0701 - 03/05 0700 In: 4158 [P.O.:200; I.V.:3608; IV Piggyback:350] Out: 2265 [Urine:1995; Chest Tube:270]  Intake/Output this shift:    Current Meds: Scheduled Meds: . amLODipine  5 mg Oral Daily  . aspirin EC  81 mg Oral Daily  . atorvastatin  20 mg Oral q1800  . bisacodyl  10 mg Oral Daily  . enoxaparin  30 mg Subcutaneous Q24H  . fentaNYL   Intravenous Q4H  . gatifloxacin  1 drop Left Eye QID  . ipratropium  1 spray Each Nare Q12H  . multivitamin with minerals  1 tablet Oral Daily  . pantoprazole  40 mg Oral Daily  . prednisoLONE acetate  1 drop Left Eye TID  . tiotropium  18 mcg Inhalation Daily   Continuous Infusions: . dextrose 5 % and 0.9% NaCl 100 mL/hr at 09/03/12 1230   PRN Meds:.calcium carbonate, diphenhydrAMINE, diphenhydrAMINE, ipratropium, naloxone, ondansetron (ZOFRAN) IV, ondansetron (ZOFRAN) IV, oxyCODONE,  oxyCODONE-acetaminophen, potassium chloride, senna-docusate, sodium chloride, traMADol  General appearance: alert and cooperative Neurologic: intact Heart: regular rate and rhythm, S1, S2 normal, no murmur, click, rub or gallop and normal apical impulse Lungs: clear to auscultation bilaterally and normal percussion bilaterally Abdomen: soft, non-tender; bowel sounds normal; no masses,  no organomegaly Extremities: extremities normal, atraumatic, no cyanosis or edema and Homans sign is negative, no sign of DVT Wound: no air leak  Lab Results: CBC: Recent Labs  09/02/12 0917 09/04/12 0415  WBC 7.7 11.2*  HGB 15.1 13.0  HCT 43.1 36.5*  PLT 252 193   BMET:  Recent Labs  09/02/12 0917 09/04/12 0415  NA 137 138  K 4.3 4.0  CL 102 106  CO2 25 25  GLUCOSE 101* 116*  BUN 18 13  CREATININE 1.21 1.06  CALCIUM 9.5 8.0*    PT/INR:  Recent Labs  09/02/12 0917  LABPROT 12.6  INR 0.95   Radiology: Dg Chest 2 View  09/02/2012  *RADIOLOGY REPORT*  Clinical Data: Preop for VATS procedure. Mediastinal mass.  CHEST - 2 VIEW  Comparison: PET CT 08/29/2012.  Findings: Stable anterior mediastinal mass.  The cardiac size is normal.  The lungs are clear.  No pleural effusion.  The bony thorax is intact.  IMPRESSION: Stable anterior  mediastinal mass on the left.   Original Report Authenticated By: Rudie Meyer, M.D.    Dg Chest Port 1 View  09/04/2012  *RADIOLOGY REPORT*  Clinical Data: Postop VATS  PORTABLE CHEST - 1 VIEW  Comparison: 09/03/2012; 09/02/2012; chest CT - 08/19/2012  Findings:  Grossly unchanged cardiac silhouette and mediastinal contours given slightly reduced lung volumes.  Stable positioning of support apparatus.  No pneumothorax.  Lung volumes are reduced with corresponding worsening of bibasilar linear heterogeneous opacities.  Trace bilateral pleural effusions.  No definite evidence of pulmonary edema.  Unchanged bones.  IMPRESSION: 1.  Stable positioning of support apparatus.   No pneumothorax. 2.  Decreased lung volumes with worsening bibasilar atelectasis. 3.  Trace bilateral effusions without evidence of edema   Original Report Authenticated By: Tacey Ruiz, MD    Dg Chest Portable 1 View  09/03/2012  *RADIOLOGY REPORT*  Clinical Data: Status post VATS with resection of anterior mediastinal mass.  PORTABLE CHEST - 1 VIEW  Comparison: 09/02/2012  Findings: Postoperatively, a left-sided chest tube is in place without pneumothorax.  Right jugular central line tip extends into the lower SVC.  The lungs show minimal left basilar atelectasis. No edema, focal consolidation or pleural fluid is identified. Cardiac and mediastinal contours are unremarkable.  IMPRESSION: No pneumothorax postoperatively.  Minimal left basilar atelectasis present.   Original Report Authenticated By: Irish Lack, M.D.      Assessment/Plan: S/P Procedure(s) (LRB): VIDEO ASSISTED THORACOSCOPY (VATS) ,Resection Anterior Mediastinal Mass (Left) FLEXIBLE BRONCHOSCOPY (N/A) Mobilize Diuresis d/c tubes/lines Plan for transfer to step-down: see transfer orders See progression orders     [name],[name] 09/04/2012 8:52 AM

## 2012-09-04 NOTE — Op Note (Signed)
[name], Michael [name] NO.:  [id number]  MEDICAL RECORD NO.:  [id number]  LOCATION:  2305                         FACILITY:  [facility]  PHYSICIAN:  Sheliah Plane, MD    DATE OF BIRTH:  14-Jul-1940  DATE OF PROCEDURE:  09/03/2012 DATE OF DISCHARGE:                              OPERATIVE REPORT   PREOPERATIVE DIAGNOSIS:  Anterior mediastinal mass.  POSTOPERATIVE DIAGNOSIS:  Anterior mediastinal mass.  Final path pending.  SURGICAL PROCEDURE:  Left video-assisted thoracoscopy and complete resection of anterior mediastinal mass and bronchoscopy.  SURGEON:  Sheliah Plane, MD  FIRST ASSISTANT:  Doree Fudge, PA  BRIEF HISTORY:  The patient is a 72 year old male who was being screened with low-dose CT scan for lung cancer and was found to have an incidental 5-cm anterior mediastinal mass.  PET scan showed hypermetabolic activity.  He had no other areas of suspicious metabolic activity or nodal enlargement. Surgical resection was recommended to the patient who agreed and signed informed consent.  DESCRIPTION OF PROCEDURE:  The patient underwent general endotracheal anesthesia.  Double-lumen endotracheal tube was placed through the double-lumen tube, a fiberoptic bronchoscope was passed to the subsegmental level both down the right and left and the endotracheal tube was right-sided tubing, was in good position.  There were no endobronchial lesions appreciated.  The scope was removed.  The patient was turned in partial lateral position and the anterior chest and left chest was prepped with Betadine and draped in a sterilely.  Initially I made a small port incision, approximately 4th of intercostal space, mid and anterior axillary line.  Through this port we had a good visualization of the mediastinal mass which appeared to be well capsulated and most solid in nature.  With the scope added, an incision was made left parasternal approximately in second  intercostal space. With the video scope and through this port, we were able to excise the mass completely, it was well capsulated and was incised completely and intact.  Through this, care was taken to avoid the phrenic nerve with the mass freed and endoscopic VAC was passed through the wound and VAC was then used to remove the specimen.  Field was hemostatic with the sponge and needle count reported as correct.  We used the port site as the chest tube site, and a 28 chest tube was left in place.  The anterior incision was then closed in muscle layers with interrupted running 2-0 Vicryl subcuticular stitch.  The subcutaneous tissue with 3-0 subcuticular stitch.  For the skin edges Dermabond was applied.  Blood loss was minimal.  The patient was awakened and extubated in the operating room without difficulty, was transferred to the recovery room in stable condition.     Sheliah Plane, MD     EG/MEDQ  D:  09/04/2012  T:  09/04/2012  Job:  [id number]  cc:   Kalman Shan, MD

## 2012-09-05 ENCOUNTER — Ambulatory Visit: Payer: Medicare Other | Admitting: Internal Medicine

## 2012-09-05 ENCOUNTER — Inpatient Hospital Stay (HOSPITAL_COMMUNITY): Payer: Medicare Other

## 2012-09-05 ENCOUNTER — Ambulatory Visit: Payer: Medicare Other | Admitting: Pulmonary Disease

## 2012-09-05 DIAGNOSIS — Z4682 Encounter for fitting and adjustment of non-vascular catheter: Secondary | ICD-10-CM | POA: Diagnosis not present

## 2012-09-05 DIAGNOSIS — J90 Pleural effusion, not elsewhere classified: Secondary | ICD-10-CM | POA: Diagnosis not present

## 2012-09-05 DIAGNOSIS — J9819 Other pulmonary collapse: Secondary | ICD-10-CM | POA: Diagnosis not present

## 2012-09-05 LAB — COMPREHENSIVE METABOLIC PANEL
ALT: 14 U/L (ref 0–53)
Alkaline Phosphatase: 51 U/L (ref 39–117)
BUN: 13 mg/dL (ref 6–23)
CO2: 27 mEq/L (ref 19–32)
GFR calc Af Amer: 69 mL/min — ABNORMAL LOW (ref >90)
GFR calc non Af Amer: 59 mL/min — ABNORMAL LOW (ref >90)
Glucose, Bld: 108 mg/dL — ABNORMAL HIGH (ref 70–99)
Potassium: 4.2 mEq/L (ref 3.5–5.1)
Total Bilirubin: 0.4 mg/dL (ref 0.3–1.2)
Total Protein: 5.8 g/dL — ABNORMAL LOW (ref 6.0–8.3)

## 2012-09-05 LAB — CBC
HCT: 37.9 % — ABNORMAL LOW (ref 39.0–52.0)
Hemoglobin: 13.2 g/dL (ref 13.0–17.0)
MCHC: 34.8 g/dL (ref 30.0–36.0)
RBC: 4.18 MIL/uL — ABNORMAL LOW (ref 4.22–5.81)

## 2012-09-05 MED ORDER — SODIUM CHLORIDE 0.9 % IJ SOLN
INTRAMUSCULAR | Status: AC
  Filled 2012-09-05: qty 10

## 2012-09-05 MED ORDER — OXYCODONE-ACETAMINOPHEN 5-325 MG PO TABS: 1.0000 | ORAL_TABLET | ORAL | Status: DC | PRN

## 2012-09-05 MED ORDER — LISINOPRIL 10 MG PO TABS: 10.0000 mg | ORAL_TABLET | Freq: Every day | ORAL | Status: DC

## 2012-09-05 NOTE — Progress Notes (Addendum)
                   [address]            [place] [address]          [phone]    2 Days Post-Op Procedure(s) (LRB): VIDEO ASSISTED THORACOSCOPY (VATS) ,Resection Anterior Mediastinal Mass (Left) FLEXIBLE BRONCHOSCOPY (N/A)  Subjective: Patient eating breakfast. He is passing flatus. He would like to go home today.  Objective: Vital signs in last 24 hours: Temp:  [97.8 F (36.6 C)-99.1 F (37.3 C)] 97.8 F (36.6 C) (03/06 0820) Pulse Rate:  [74-106] 84 (03/06 0400) Cardiac Rhythm:  [-] Normal sinus rhythm (03/06 0400) Resp:  [13-27] 19 (03/06 0400) BP: (108-158)/(66-86) 124/72 mmHg (03/06 0400) SpO2:  [92 %-98 %] 92 % (03/06 0400)     Intake/Output from previous day: 03/05 0701 - 03/06 0700 In: 1681.9 [P.O.:950; I.V.:731.9] Out: 1450 [Urine:1360; Chest Tube:90]   Physical Exam:  Cardiovascular: RRR Pulmonary: Slightly diminished at left base; no rales, wheezes, or rhonchi. Abdomen: Soft, non tender, bowel sounds present. Wounds: Clean and dry.  No erythema or signs of infection.   Lab Results: CBC: Recent Labs  09/04/12 0415 09/05/12 0405  WBC 11.2* 10.3  HGB 13.0 13.2  HCT 36.5* 37.9*  PLT 193 191   BMET:  Recent Labs  09/04/12 0415 09/05/12 0405  NA 138 138  K 4.0 4.2  CL 106 105  CO2 25 27  GLUCOSE 116* 108*  BUN 13 13  CREATININE 1.06 1.21  CALCIUM 8.0* 8.3*    PT/INR:  Recent Labs  09/02/12 0917  LABPROT 12.6  INR 0.95   ABG:  INR: Will add last result for INR, ABG once components are confirmed Will add last 4 CBG results once components are confirmed  Assessment/Plan:  1. CV - SR. 2.  Pulmonary - CXR this am shows no pneumothorax, low lung volumes, bibasilar atelectasis, and small pleural effusion.  Final pathology is pending. 3.Stop PCA 4.Remove central line 5.Will discuss discharge disposition (later today vs am) with Dr. Cristie Hem MPA-C 09/05/2012,8:26 AM  Patient walking around  well, wants to go home. D/c pca and if pain controlled on po med will d/c home later today Discussed with Pathology today, path still pending I have seen and examined Roseanne Reno and agree with the above assessment  and plan.  Delight Ovens MD Beeper [phone] Office [phone] 09/05/2012 8:50 AM

## 2012-09-05 NOTE — Discharge Summary (Signed)
Physician Discharge Summary  Patient ID: Torren Maffeo MRN: [id number] DOB/AGE: [birth year] 72 y.o.  Admit date: 09/03/2012 Discharge date: 09/05/2012  Admission Diagnoses: 1.Anterior mediastinal mass 2.Essential, benign hypertension 3.History of hyperlipidemia 4.History of COPD 5.History GERD  Discharge Diagnoses:  1.Anterior mediastinal mass 2.Essential, benign hypertension 3.History of hyperlipidemia 4.History of COPD 5.History GERD   Procedure (s):  Left video-assisted thoracoscopy and complete  resection of anterior mediastinal mass and bronchoscopy by Dr. Tyrone Sage by 09/03/2012.   History of Presenting Illness: This is a 72 year old Caucasian male with a previous diagnosis of COPD. He notes that approximately one half years ago he had some vague shortness of breath with exertion. Screening pulmonary function studies suggest the an FEV1 2.1 67%. He was started on Spiriva and he notes that has helped some. He saw an emergency room physician in [place] who suggested that he see a pulmonologist when he returned home. He was seen in the  pulmonology office by Dr. Marchelle Gearing. Because of his previous history of smoking a pack a day for approximately 25 years and stopping 10/02/1982, a screening CT scan was ordered. 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 The patient denies any chest pain, has had no fever chills or night sweats, has no muscle weakness or history of myasthenia. He has been seen by [facility] cardiology for episodes of dizziness several years ago. A stress test was reported as normal at that time. He's had no known history of cardiac events and denies consistent exertional related chest pain. His CT scan does reveal evidence of calcification of his coronary arteries, including calcium in the left main. The patient returned to the office after pulmonary function studies and a PET scan was completed. In  addition, I contacted his cardiologist who has changed his statin but from his recent visit did not feel any further workup was indicated prior to thoracic surgery. The patient denies any angina evidence of congestive heart failure or any previous history of myocardial infarction. Potential risks, complications, and benefits of resection of the anterior mediastinal mass were discussed with the patient and he agreed to proceed. He underwent a bronchoscopy, left VATS, and resection of the anterior mediastinal mass on 09/03/2012.   Brief Hospital Course:  He has remained afebrile and hemodynamically stable. His chest tube did not have an air leak. Daily chest x rays were obtained and remained stable. His a line and foley were removed on post operative day one. He was felt surgically stable for transfer from the ICU to [facility] for further convalescence on 09/04/2012.His chest tube was removed on this date. He has been tolerating a diet and passing flatus. He is ambulating on room air. His PCA and central line will be removed today. His chest x ray today is again stable. He has been seen and evaluated by Dr. Tyrone Sage.He is felt surgically stable for discharge today.   Latest Vital Signs: Blood pressure 134/73, pulse 84, temperature 97.8 F (36.6 C), temperature source Oral, resp. rate 21, height 5\' 9"  (1.753 m), weight 85.276 kg (188 lb), SpO2 95.00%.  Physical Exam: Cardiovascular: RRR  Pulmonary: Slightly diminished at left base; no rales, wheezes, or rhonchi.  Abdomen: Soft, non tender, bowel sounds present.  Wounds: Clean and dry. No erythema or signs of infection.   Discharge Condition:Stable  Recent laboratory studies:  Lab Results  Component Value Date   WBC 10.3 09/05/2012   HGB 13.2 09/05/2012   HCT 37.9*  09/05/2012   MCV 90.7 09/05/2012   PLT 191 09/05/2012   Lab Results  Component Value Date   NA 138 09/05/2012   K 4.2 09/05/2012   CL 105 09/05/2012   CO2 27 09/05/2012   CREATININE 1.21 09/05/2012    GLUCOSE 108* 09/05/2012      Diagnostic Studies: Dg Chest 2 View  09/05/2012  *RADIOLOGY REPORT*  Clinical Data: Post removal of thymoma.  Post chest tube removal  CHEST - 2 VIEW  Comparison: 09/04/2012  Findings: Low lung volumes persist.  There has been removal of the left-sided chest tube.  No pneumothorax is seen.  A trace of left pleural fluid is noted.  A right internal jugular CVP is in place and unchanged in position in comparison with the prior exam.  Heart and mediastinal contours are within normal limits given the low lung volumes.  Bibasilar subsegmental atelectasis is seen.  The lung fields are otherwise clear.  Bony structures demonstrate mild anterior vertebral body height loss in the mid thoracic spine and are otherwise intact.  IMPRESSION: Low lung volumes with bibasilar atelectasis and small left pleural effusion, post chest tube removal.  No new worrisome findings identified.   Original Report Authenticated By: Rhodia Albright, M.D.      Nm Pet Image Initial (pi) Skull Base To Thigh  08/29/2012  *RADIOLOGY REPORT*  Clinical Data: Initial treatment strategy for anterior mediastinal mass.  NUCLEAR MEDICINE PET SKULL BASE TO THIGH  Fasting Blood Glucose:  106  Technique:  19.8  mCi F-18 FDG was injected intravenously. CT data was obtained and used for attenuation correction and anatomic localization only.  (This was not acquired as a diagnostic CT examination.) Additional exam technical data entered on technologist worksheet.  Comparison:  CT thorax 08/19/2012  Findings:  Neck: No hypermetabolic lymph nodes in the neck.  Chest:  Within the anterior mediastinum there is a rounded 5.3 x 3.9 cm mass which is heterogeneous in density on noncontrast CT. The portion of the lesion which is more dense has intense metabolic activity with SUV max = 8.2.  No additional hypermetabolic masses or nodes in the mediastinum.  No hypermetabolic pulmonary nodules.  Abdomen/Pelvis:  No abnormal hypermetabolic  activity within the liver, pancreas, adrenal glands, or spleen.  No hypermetabolic lymph nodes in the abdomen or pelvis.  Skeleton:  No focal hypermetabolic activity to suggest skeletal metastasis.  IMPRESSION:  1.  Hypermetabolic rounded anterior mediastinal mass is consistent with benign or malignant neoplasm.  Recommend biopsy versus surgical resection. 2.  No evidence of local nodal metastasis or distant metastasis.   Original Report Authenticated By: Genevive Bi, M.D.    Ct Chest Low Dose Pilot W/o Cm  08/20/2012  *RADIOLOGY REPORT*  Clinical Data: 72 year old male with a prior history of smoking. Lung cancer screening.  CT CHEST LOW DOSE PILOT WITHOUT CONTRAST  Technique: Multidetector CT imaging of the chest using the standard low-dose protocol without administration of intravenous contrast.  Comparison: No priors.  Findings:  Mediastinum: In the anterior mediastinum there is a 5.2 x 4.0 cm smoothly marginated ovoid shaped lesion that has heterogeneous internal attenuation.  There is a well-defined fat plane between this lesion and the adjacent proximal aortic arch.  No additional mediastinal or hilar lymph nodes or masses are otherwise noted. Heart size is normal. There is no significant pericardial fluid, thickening or pericardial calcification. There is atherosclerosis of the thoracic aorta, the great vessels of the mediastinum and the coronary arteries, including calcified atherosclerotic plaque  in the left main coronary arteries. Esophagus is unremarkable in appearance.  Lungs/Pleura: There is a 2 mm nodule in the anterior aspect of the right upper lobe (image 101 of series 5), and a 3 mm subpleural nodule in the periphery of the left upper lobe (image 99 of series 5).  No other larger more suspicious appearing pulmonary nodules or masses are otherwise noted.  There is a background of mild centrilobular emphysema and mild diffuse bronchial wall thickening. No acute consolidative airspace disease.   No pleural effusions. Linear opacities in the lower lobes of the lungs bilaterally may reflect areas of subsegmental atelectasis or scarring.  Upper Abdomen: Unremarkable.  Musculoskeletal: There are no aggressive appearing lytic or blastic lesions noted in the visualized portions of the skeleton.  IMPRESSION: 1.  Small 2 and 3 mm nodules in the right upper lobe and left upper lobe respectively, as discussed above. Annual low dose CT examination is recommended for the next 3 years and until the patient is 72 years of age.  This recommendation follows the NCCN Guidelines for Lung Cancer Screening. 2.  5.2 x 4.0 cm smoothly marginated anterior mediastinal mass. This has a heterogeneous internal attenuation suggesting internal areas of cystic change or necrosis.  Differential considerations favor an enlarged necrotic lymph node, potentially in the setting of unrecognized lymphoma.  Other considerations for an anterior mediastinal mass with this appearance include a germ cell neoplasm, thymic lesion, or lesion of thyroid origin (however, a thyroidal lesion is not favored as this lesion is clearly separate from the thyroid gland). 3. Atherosclerosis, including left main coronary artery disease. Assessment for potential risk factor modification, dietary therapy or pharmacologic therapy may be warranted, if clinically indicated. 4.  Mild diffuse bronchial wall thickening with mild centrilobular and paraseptal emphysema.  These results will be called to the ordering clinician or representative by the Radiologist Assistant, and communication documented in the PACS Dashboard.   Original Report Authenticated By: Trudie Reed, M.D.     Future Appointments Provider Department Dept Phone   09/19/2012 2:00 PM Delight Ovens, MD [facility] [phone]   10/08/2012 2:00 PM Kalman Shan, MD [facility] [phone]   10/09/2012 11:00 AM Hilarie Fredrickson, MD [facility] [phone]      Discharge Medications:   Medication List    TAKE these medications       ALPRAZolam 0.25 MG tablet  Commonly known as:  XANAX  Take 0.25 mg by mouth daily as needed. For anxiety     amLODipine 5 MG tablet  Commonly known as:  NORVASC  Take 1.5 tablets (7.5 mg total) by mouth daily.     aspirin 81 MG tablet  Take 81 mg by mouth daily.     CENTRUM SILVER PO  Take by mouth.     CITRACAL PLUS BONE DENSITY PO  Take 1 tablet by mouth daily.     famotidine 20 MG tablet  Commonly known as:  PEPCID  Take 20 mg by mouth daily as needed. For heartburn     ipratropium 0.02 % nebulizer solution  Commonly known as:  ATROVENT  Take 500 mcg by nebulization as directed.     ipratropium 0.03 % nasal spray  Commonly known as:  ATROVENT  Place 2 sprays into the nose every 12 (twelve) hours.     lisinopril 10 MG tablet  Commonly known as:  PRINIVIL,ZESTRIL  Take 1 tablet (10 mg total) by mouth daily.  Start  taking on:  09/12/2012     moxifloxacin 0.5 % ophthalmic solution  Commonly known as:  VIGAMOX  Place 1 drop into the left eye 4 (four) times daily.     oxyCODONE-acetaminophen 5-325 MG per tablet  Commonly known as:  PERCOCET/ROXICET  Take 1-2 tablets by mouth every 4 (four) hours as needed for pain.     prednisoLONE acetate 1 % ophthalmic suspension  Commonly known as:  PRED FORTE  Place 1 drop into the left eye 3 (three) times daily.     PROLENSA 0.07 % Soln  Generic drug:  Bromfenac Sodium  Apply to eye 1 day or 1 dose.     simvastatin 40 MG tablet  Commonly known as:  ZOCOR  Take 40 mg by mouth every evening.     tiotropium 18 MCG inhalation capsule  Commonly known as:  SPIRIVA  Place 18 mcg into inhaler and inhale daily.        Follow Up Appointments:     Follow-up Information   Follow up with [name],[name], MD. (PA/LAT CXR to be taken on 09/19/2012 at 1:00 pm (at [facility] which is in the same  building as Dr. Dennie Maizes office);Appoinmtent with Dr. Tyrone Sage is on 09/19/2012 at 2:00 pm)    Contact information:   [address] [place] [address] [phone]       Signed: Doree Fudge MPA-C 09/05/2012, 9:33 AM

## 2012-09-05 NOTE — Progress Notes (Signed)
Patient was discharged home around 1:30pm via wheelchair. IV from the hand has been D/C. Reviewed all discharge instructions with the patient and patient's son ,all questions and concerns have been answered.

## 2012-09-06 ENCOUNTER — Ambulatory Visit: Payer: Medicare Other | Admitting: Internal Medicine

## 2012-09-17 ENCOUNTER — Other Ambulatory Visit: Payer: Self-pay | Admitting: *Deleted

## 2012-09-17 DIAGNOSIS — R222 Localized swelling, mass and lump, trunk: Secondary | ICD-10-CM

## 2012-09-19 ENCOUNTER — Ambulatory Visit (INDEPENDENT_AMBULATORY_CARE_PROVIDER_SITE_OTHER): Payer: Self-pay | Admitting: Cardiothoracic Surgery

## 2012-09-19 ENCOUNTER — Encounter: Payer: Self-pay | Admitting: Cardiothoracic Surgery

## 2012-09-19 ENCOUNTER — Ambulatory Visit
Admission: RE | Admit: 2012-09-19 | Discharge: 2012-09-19 | Disposition: A | Discharge status: Home / Self Care | Payer: Medicare Other | Source: Ambulatory Visit | Attending: Cardiothoracic Surgery | Admitting: Cardiothoracic Surgery

## 2012-09-19 ENCOUNTER — Encounter: Payer: Self-pay | Admitting: Cardiology

## 2012-09-19 VITALS — BP 123/74 | HR 74 | Resp 16 | Ht 70.0 in | Wt 186.0 lb

## 2012-09-19 DIAGNOSIS — J9859 Other diseases of mediastinum, not elsewhere classified: Secondary | ICD-10-CM

## 2012-09-19 DIAGNOSIS — R222 Localized swelling, mass and lump, trunk: Secondary | ICD-10-CM

## 2012-09-19 DIAGNOSIS — Z09 Encounter for follow-up examination after completed treatment for conditions other than malignant neoplasm: Secondary | ICD-10-CM

## 2012-09-19 DIAGNOSIS — R918 Other nonspecific abnormal finding of lung field: Secondary | ICD-10-CM

## 2012-09-19 NOTE — Progress Notes (Signed)
[address]            [place] [address]          [phone]       Zalan Shidler [facility] Medical Record #[id number] Date of Birth: 1940/08/01  Kalman Shan, MD Garlan Fillers, MD  Chief Complaint:   PostOp Follow Up Visit 09/03/2012   OPERATIVE REPORT  PREOPERATIVE DIAGNOSIS: Anterior mediastinal mass.  POSTOPERATIVE DIAGNOSIS: Anterior mediastinal mass. Final path  pending.  SURGICAL PROCEDURE: Left video-assisted thoracoscopy and complete  resection of anterior mediastinal mass and bronchoscopy.   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 He's done well postoperatively returning to near-normal activities already.     History  Smoking status  . Former Smoker -- 1.00 packs/day for 25 years  . Types: Cigarettes  . Quit date: 07/03/1982  Smokeless tobacco  . Not on file       No Known Allergies  Current Outpatient Prescriptions  Medication Sig Dispense Refill  . ALPRAZolam (XANAX) 0.25 MG tablet Take 0.25 mg by mouth daily as needed. For anxiety      . amLODipine (NORVASC) 5 MG tablet Take 1.5 tablets (7.5 mg total) by mouth daily.  135 tablet  3  . aspirin 81 MG tablet Take 81 mg by mouth daily.        . famotidine (PEPCID) 20 MG tablet Take 20 mg by mouth daily as needed. For heartburn      . ibuprofen (ADVIL,MOTRIN) 200 MG tablet Take 200 mg by mouth every 8 (eight) hours as needed for pain.      Marland Kitchen ipratropium (ATROVENT) 0.03 % nasal spray Place 2 sprays into the nose as needed.       Marland Kitchen lisinopril (PRINIVIL,ZESTRIL) 10 MG tablet Take 1 tablet (10 mg total) by mouth daily.      . Multiple Minerals-Vitamins (CITRACAL PLUS BONE DENSITY PO) Take 1 tablet by mouth daily.        . Multiple Vitamins-Minerals (CENTRUM  SILVER PO) Take by mouth.      . simvastatin (ZOCOR) 40 MG tablet Take 40 mg by mouth every evening.      . tiotropium (SPIRIVA) 18 MCG inhalation capsule Place 18 mcg into inhaler and inhale daily.       No current facility-administered medications for this visit.       Physical Exam: BP 123/74  Pulse 74  Resp 16  Ht 5\' 10"  (1.778 m)  Wt 186 lb (84.369 kg)  BMI 26.69 kg/m2  SpO2 98%  General appearance: alert and cooperative Neurologic: intact Heart: regular rate and rhythm, S1, S2 normal, no murmur, click, rub or gallop and normal apical impulse Lungs: clear to auscultation bilaterally and normal percussion bilaterally Abdomen: soft, non-tender; bowel sounds normal; no masses,  no organomegaly Extremities: extremities normal, atraumatic, no cyanosis or edema and Homans sign is negative, no sign of DVT Wound: His left chest incisions are all well-healed    Diagnostic Studies & Laboratory data:         Recent Radiology Findings: Dg Chest 2 View  09/19/2012  *RADIOLOGY REPORT*  Clinical Data: Left VATS for resection of the anterior mediastinal mass  CHEST - 2  VIEW  Comparison: Chest x-ray of 09/05/2012  Findings: The small pleural effusions have cleared, and the lungs appear better aerated.  Mediastinal contours are stable.  The heart is within upper limits normal.  No acute bony abnormality is seen.  IMPRESSION: No active lung disease.  The prior small pleural effusions have cleared.   Original Report Authenticated By: Dwyane Dee, M.D.       Recent Labs: Lab Results  Component Value Date   WBC 10.3 09/05/2012   HGB 13.2 09/05/2012   HCT 37.9* 09/05/2012   PLT 191 09/05/2012   GLUCOSE 108* 09/05/2012   CHOL 186 10/24/2010   TRIG 59.0 10/24/2010   HDL 67.80 10/24/2010   LDLCALC 106* 10/24/2010   ALT 14 09/05/2012   AST 19 09/05/2012   NA 138 09/05/2012   K 4.2 09/05/2012   CL 105 09/05/2012   CREATININE 1.21 09/05/2012   BUN 13 09/05/2012   CO2 27 09/05/2012   INR 0.95 09/02/2012    PATH: Mediastinum, mass resection, Anterior - THYMOMA, 6.4 CM - SEE COMMENT. Microscopic Comment The specimen reveals an encapsulated well circumscribed mass. Histologic evaluation reveals a mixed population of mature appearing lymphocytes and macrophages. Immunohistochemical stains for CD3, CD79a, CD43, CD20 and CD5, reveal that the vast majority of the cells are T cells.   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 We'll plan to see the patient back in 3 months with a followup chest x-ray.    Aquita Simmering B 09/19/2012 3:08 PM

## 2012-10-08 ENCOUNTER — Ambulatory Visit (INDEPENDENT_AMBULATORY_CARE_PROVIDER_SITE_OTHER): Payer: Medicare Other | Admitting: Internal Medicine

## 2012-10-08 ENCOUNTER — Encounter: Payer: Self-pay | Admitting: Internal Medicine

## 2012-10-08 VITALS — BP 124/82 | HR 80 | Temp 97.5°F | Ht 70.0 in | Wt 186.0 lb

## 2012-10-08 DIAGNOSIS — D150 Benign neoplasm of thymus: Secondary | ICD-10-CM | POA: Diagnosis not present

## 2012-10-08 DIAGNOSIS — J449 Chronic obstructive pulmonary disease, unspecified: Secondary | ICD-10-CM | POA: Diagnosis not present

## 2012-10-08 DIAGNOSIS — Z129 Encounter for screening for malignant neoplasm, site unspecified: Secondary | ICD-10-CM

## 2012-10-08 NOTE — Assessment & Plan Note (Signed)
3 mm right upper lobe and left upper lobe nodule seen on low-dose CT scan of the chest for lung cancer screening in February 2014. His next followup CT scan of the chest will be in March 2015 but this will be in the look into surveillance for thymoma surveillance. I have e-mailed Dr. Tyrone Sage to see who will be responsible for the thymoma scans

## 2012-10-08 NOTE — Assessment & Plan Note (Signed)
#  COPD - Stable disease - Please Join [employer] and try to get fit - Continue Spiriva 1 puff daily; anytime you need 90 day refills please call her office - Get flu shot as early as August/September each year; advice the high-dose vaccine - Nurse will ensure that you are up-to-date with pneumonia vaccine called Pneumovax - alpha 1 check at fu

## 2012-10-08 NOTE — Patient Instructions (Addendum)
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 Tyrone Sage so that we will coordinate will be in charge of the scans  #3 mm lung nodule right upper lobe and left upper lobe - These will be followed up when you have annual CT scan of the chest for thymoma  #Followup - 9 months or sooner if needed

## 2012-10-08 NOTE — Assessment & Plan Note (Signed)
 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 Tyrone Sage so that we will coordinate will be in charge of the scans \

## 2012-10-08 NOTE — Progress Notes (Signed)
Subjective:    Patient ID: Michael [name], male    DOB: [birth year], 72 y.o.   MRN: [id number]  HPI  #Thymoma stage I  - Diagnosed during low-dose CT scan of the chest screening program for lung cancer - Status post complete resection without residual disease September 03, 2012 - Followup Dr Tyrone Sage  #COPD mild, Gold stage I versus mild asthma - COPD cat score 4 on 10/08/2012  - Pulmonary function test done February 2014: Prebronchodilator FEV1 while on Spiriva 2.4 L/76%. Postbronchodilator FEV1 2.6 L/82% with FEV1 FEC ratio of 69 prebronchodilator and 76 post bronchodilator. Diffusion capacity 90%   #Pulmonary nodule - 3 mm lung nodule right upper lobe and left upper lobe diagnosed February 2014 during CT scan chest screening program for lung cancer   OV 10/08/2012  Followup for all of the above. He is now status post resection of thymoma which is now stage I. Looks like she will have annual surveillance CT scan of the chest with Dr. Tyrone Sage He is doing well. In retrospect he recollects retrosternal pain that is now completely resolved after surgery. He also does have some fatigue which is better. He denies any myasthenia symptoms. He continues with Spiriva and he finds good relief. I reviewed his pulmonary function test and is more suggestive of asthma because of normal diffusion capacity but he is content taking Spiriva and does not want to switch.  Currently he is putting his life back together and wants to focus on fitness by joining the [facility]  There are no new issues.   Review of Systems  Constitutional: Negative for fever and unexpected weight change.  HENT: Negative for ear pain, nosebleeds, congestion, sore throat, rhinorrhea, sneezing, trouble swallowing, dental problem, postnasal drip and sinus pressure.   Eyes: Negative for redness and itching.  Respiratory: Negative for cough, chest tightness, shortness of breath and wheezing.   Cardiovascular: Negative for palpitations and leg  swelling.  Gastrointestinal: Negative for nausea and vomiting.  Genitourinary: Negative for dysuria.  Musculoskeletal: Negative for joint swelling.  Skin: Negative for rash.  Neurological: Negative for headaches.  Hematological: Does not bruise/bleed easily.  Psychiatric/Behavioral: Negative for dysphoric mood. The patient is not nervous/anxious.        Objective:   Physical Exam  Nursing note and vitals reviewed. Constitutional: He is oriented to person, place, and time. He appears well-developed and well-nourished. No distress.  HENT:  Head: Normocephalic and atraumatic.  Right Ear: External ear normal.  Left Ear: External ear normal.  Mouth/Throat: Oropharynx is clear and moist. No oropharyngeal exudate.  Eyes: Conjunctivae and EOM are normal. Pupils are equal, round, and reactive to light. Right eye exhibits no discharge. Left eye exhibits no discharge. No scleral icterus.  Neck: Normal range of motion. Neck supple. No JVD present. No tracheal deviation present. No thyromegaly present.  Cardiovascular: Normal rate, regular rhythm and intact distal pulses.  Exam reveals no gallop and no friction rub.   No murmur heard. Pulmonary/Chest: Effort normal and breath sounds normal. No respiratory distress. He has no wheezes. He has no rales. He exhibits no tenderness.  Abdominal: Soft. Bowel sounds are normal. He exhibits no distension and no mass. There is no tenderness. There is no rebound and no guarding.  Musculoskeletal: Normal range of motion. He exhibits no tenderness.  Lymphadenopathy:    He has no cervical adenopathy.  Neurological: He is alert and oriented to person, place, and time. He has normal reflexes. No cranial nerve deficit. Coordination normal.  Skin:  Skin is warm and dry. No rash noted. He is not diaphoretic. No erythema. No pallor.  Psychiatric: He has a normal mood and affect. His behavior is normal. Judgment and thought content normal.          Assessment &  Plan:

## 2012-10-09 ENCOUNTER — Encounter: Payer: Self-pay | Admitting: Internal Medicine

## 2012-10-09 ENCOUNTER — Telehealth: Payer: Self-pay

## 2012-10-09 ENCOUNTER — Ambulatory Visit (INDEPENDENT_AMBULATORY_CARE_PROVIDER_SITE_OTHER): Payer: Medicare Other | Admitting: Internal Medicine

## 2012-10-09 VITALS — BP 126/82 | HR 78 | Ht 70.0 in | Wt 186.0 lb

## 2012-10-09 DIAGNOSIS — K219 Gastro-esophageal reflux disease without esophagitis: Secondary | ICD-10-CM | POA: Diagnosis not present

## 2012-10-09 DIAGNOSIS — R195 Other fecal abnormalities: Secondary | ICD-10-CM

## 2012-10-09 DIAGNOSIS — Z86010 Personal history of colonic polyps: Secondary | ICD-10-CM

## 2012-10-09 NOTE — Telephone Encounter (Signed)
Michael [name], I saw this patient today in the office. It seems that his doctor wanted him to come last year regarding Hemoccult-positive stool. After reviewing the patient's chart, evaluating his history and examining him today, and recent normal blood work with normal Hemoccults, he does not need anything further done. I told patient that I would look into this. Please let her know that I have looked into this for him. Nothing further needs to be done at this point, as he and I discussed. Thank you

## 2012-10-09 NOTE — Patient Instructions (Addendum)
Dr. Marina Goodell will confer with Dr. Silvano Rusk nurse for clarification and then decide how to proceed.  We will contact you at that time

## 2012-10-09 NOTE — Telephone Encounter (Signed)
Spoke with D.J., Dr. Silvano Rusk nurse at [facility], to clarify the inconsistencies between the notes we received from Dr. Silvano Rusk office and what the patient was telling us.  Patient was adamant that his hemoccult test at his 05/2012 office visit with Dr. Eloise Harman was positive and that he had been advised to see [place] for further evaluation.  D.J. reviewed pt's most recent office note and labs as well as his 2012 office note and labs.  Patient's 2012 hemoccult test was positive and pt was advised to follow up with Dr. Marina Goodell, which patient never did.  His most recent o/v with Dr. Jarold Motto, in 2013, produced a negative hemoccult test and nothing was mentioned in the note regarding the patient following up with Dr. Marina Goodell.  Patient scheduled this appointment with Dr. Marina Goodell himself, as opposed to being scheduled with Malachi Bonds in Dr. Silvano Rusk office as per usual with a referral.  Patient originally scheduled appointment in 07/2012 and cancelled 3 consecutive appointments before coming in today.  D.J. Suspects he might be thinking of/looking at his 2012 result and getting confused.  She also mentioned that their relationship with pt had been somewhat rocky because patient had been considering leaving their practice in spite of the fact many of his complaints stemmed from noncompliance on the patient's part.

## 2012-10-09 NOTE — Telephone Encounter (Signed)
Spoke with Michael [name] and she is aware.

## 2012-10-09 NOTE — Progress Notes (Signed)
HISTORY OF PRESENT ILLNESS:  Michael [name] is a 72 y.o. male with the below listed medical history. He presents today regarding Hemoccult-positive stool. It took a little while the sort things out, and I have called Dr. Silvano Rusk office, but it appears that the patient had an isolated Hemoccult-positive stool in 2012. It was recommended that he come to this office at that time. Apparently had canceled appointments. However he is here at this time. In any event, the patient had a colonoscopy in [place] in April 2007 with both tubulovillous adenoma and adenoma. I performed colonoscopy on the patient in March of 2011. The examination was complete with excellent preparation peer he was found to have diminutive descending colon polyp and mild sigmoid diverticulosis. Otherwise normal. Followup in 5 years recommended. His GI review of systems is remarkable for infrequent GERD for which he takes Pepcid with relief. As well chronic stable mild infrequent constipation and diarrhea. Rare traces of blood on the tissue from known hemorrhoids. Nothing in recent months. He does tell me that he had a thymoma, picked up incidentally after pulmonary evaluation, removed about a month ago. He is doing well. I did have the opportunity to review outside blood work from November 2013. Normal blood work including hemoglobin of 15.8. Stool Hemoccults at that time were negative. CBC during his hospitalization was about 13.2. Hemoglobin.  REVIEW OF SYSTEMS:  All non-GI ROS negative   Past Medical History  Diagnosis Date  . Essential hypertension, benign   . Hyperlipidemia   . History of colonic polyps   . Lightheadedness     workup for this about 1.5 years ago included a 3 week event monitor and an echo, which were both unremarkable. Of note, patient was no longer having lightheaded spells when he wore the monitor  . GERD (gastroesophageal reflux disease)   . Diverticulosis   . COPD (chronic obstructive pulmonary disease)      Past Surgical History  Procedure Laterality Date  . Cataract extraction    . Eye surgery Right     muscle   . Video assisted thoracoscopy (vats)/wedge resection Left 09/03/2012    Procedure: VIDEO ASSISTED THORACOSCOPY (VATS) ,Resection Anterior Mediastinal Mass;  Surgeon: Delight Ovens, MD;  Location: [facility] OR;  Service: Thoracic;  Laterality: Left;  . Flexible bronchoscopy N/A 09/03/2012    Procedure: FLEXIBLE BRONCHOSCOPY;  Surgeon: Delight Ovens, MD;  Location: [facility] OR;  Service: Thoracic;  Laterality: N/A;    Social History Michael [name]  reports that he quit smoking about 30 years ago. His smoking use included Cigarettes. He has a 25 pack-year smoking history. He has never used smokeless tobacco. He reports that he drinks about 7.0 ounces of alcohol per week. He reports that he does not use illicit drugs.  family history includes Cancer in his mother and Emphysema in his father.  There is no history of Coronary artery disease.  No Known Allergies     PHYSICAL EXAMINATION: Vital signs: BP 126/82  Pulse 78  Ht 5\' 10"  (1.778 m)  Wt 186 lb (84.369 kg)  BMI 26.69 kg/m2 General: Well-developed, well-nourished, no acute distress HEENT: Sclerae are anicteric, conjunctiva pink. Oral mucosa intact Lungs: Clear Heart: Regular Abdomen: soft, nontender, nondistended, no obvious ascites, no peritoneal signs, normal bowel sounds. No organomegaly. Extremities: No edema Psychiatric: alert and oriented x3. Cooperative   ASSESSMENT:  #1. Hemoccult-positive stool, apparently, 2012. This one year after full colonoscopy. Currently with normal hemoglobin, Hemoccult negative stool, negative GI review of systems.  No further workup indicated at this time. #2. GERD. Frequent. No alarm symptoms #3. History of adenomatous colon polyps. Due for routine followup March 2016   PLAN:   #1. Reflux precautions #2. H2 receptor antagonist therapy on demand #3. Surveillance colonoscopy 2016.  Interval followup as needed

## 2012-10-10 ENCOUNTER — Telehealth: Payer: Self-pay

## 2012-10-10 NOTE — Telephone Encounter (Signed)
Message copied by Chrystie Nose on Thu Oct 10, 2012  9:05 AM ------      Message from: Hilarie Fredrickson      Created: Wed Oct 09, 2012  5:07 PM      Regarding: Please call the patient       Michael [name],            In addition to Michael [name], I wanted you to let the Patient  know that I received outside records, and spoke with Dr. Norval Gable office, and nothing further needs to be done. As he and I discussed, he will need routine followup colonoscopy in 2016. Please let him know. Thanks ------

## 2012-10-10 NOTE — Telephone Encounter (Signed)
Pt aware.

## 2012-10-31 ENCOUNTER — Telehealth: Payer: Self-pay | Admitting: Internal Medicine

## 2012-10-31 MED ORDER — TIOTROPIUM BROMIDE MONOHYDRATE 18 MCG IN CAPS: 18.0000 ug | ORAL_CAPSULE | Freq: Every day | RESPIRATORY_TRACT | Status: DC

## 2012-10-31 NOTE — Telephone Encounter (Signed)
I spoke with pt and is aware RX's have been sent. Nothing further was needed

## 2012-11-11 ENCOUNTER — Telehealth: Payer: Self-pay | Admitting: Cardiology

## 2012-11-11 DIAGNOSIS — E785 Hyperlipidemia, unspecified: Secondary | ICD-10-CM

## 2012-11-11 MED ORDER — ATORVASTATIN CALCIUM 40 MG PO TABS: 40.0000 mg | ORAL_TABLET | Freq: Every day | ORAL | Status: DC

## 2012-11-11 NOTE — Telephone Encounter (Signed)
Spoke with patient who states that several months ago Dr. Alford Highland nurse called and Dr. Shirlee Latch wanted to change his statin to Atorvastatin 40  Mg due to patient's recent test results as he felt this would better manage patient's hyperlipidemia.  Patient states at the time he had just received a 90-day supply of Simvastatin so Thurston Hole, RN told him to call back when he ran out of that medication and we would order the Atorvastatin.  I verified this from patient's chart and ordered Atorvastatin 40 mg from patient's pharmacy.  Patient verbalized understanding that he should receive the medication in due time from his mail order pharmacy. Simvastatin D/C'ed. I also advised patient that he needs fasting lipid/liver profile in 2 months. Labs ordered and patient verbalized understanding to fast for this appointment

## 2012-11-11 NOTE — Telephone Encounter (Signed)
New problem   Pt want to talk to a nurse concerning new medication Dr Shirlee Latch want to put him on so he can get a prescription for it. Please call pt

## 2012-12-23 ENCOUNTER — Other Ambulatory Visit: Payer: Self-pay | Admitting: *Deleted

## 2012-12-23 DIAGNOSIS — J9859 Other diseases of mediastinum, not elsewhere classified: Secondary | ICD-10-CM

## 2012-12-26 ENCOUNTER — Ambulatory Visit
Admission: RE | Admit: 2012-12-26 | Discharge: 2012-12-26 | Disposition: A | Discharge status: Home / Self Care | Payer: Medicare Other | Source: Ambulatory Visit | Attending: Cardiothoracic Surgery | Admitting: Cardiothoracic Surgery

## 2012-12-26 ENCOUNTER — Encounter: Payer: Self-pay | Admitting: *Deleted

## 2012-12-26 ENCOUNTER — Ambulatory Visit (INDEPENDENT_AMBULATORY_CARE_PROVIDER_SITE_OTHER): Payer: Medicare Other | Admitting: Cardiothoracic Surgery

## 2012-12-26 ENCOUNTER — Encounter: Payer: Self-pay | Admitting: Cardiothoracic Surgery

## 2012-12-26 VITALS — BP 138/83 | HR 75 | Resp 16 | Ht 70.0 in | Wt 187.0 lb

## 2012-12-26 DIAGNOSIS — J9859 Other diseases of mediastinum, not elsewhere classified: Secondary | ICD-10-CM

## 2012-12-26 DIAGNOSIS — Z09 Encounter for follow-up examination after completed treatment for conditions other than malignant neoplasm: Secondary | ICD-10-CM

## 2012-12-26 DIAGNOSIS — D150 Benign neoplasm of thymus: Secondary | ICD-10-CM | POA: Diagnosis not present

## 2012-12-26 DIAGNOSIS — R161 Splenomegaly, not elsewhere classified: Secondary | ICD-10-CM | POA: Diagnosis not present

## 2012-12-26 NOTE — Progress Notes (Signed)
[address]       [place] [address]             [phone]                          Michael [name] [facility] Medical Record #[id number] Date of Birth: 07-01-1941  Kalman Shan, MD Garlan Fillers, MD  Chief Complaint:   PostOp Follow Up Visit 09/03/2012   OPERATIVE REPORT  PREOPERATIVE DIAGNOSIS: Anterior mediastinal mass.  POSTOPERATIVE DIAGNOSIS: Anterior mediastinal mass. Final path  pending.  SURGICAL PROCEDURE: Left video-assisted thoracoscopy and complete  resection of anterior mediastinal mass and bronchoscopy.   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 He's done well postoperatively returning to near-normal activities already. Now  More then 3 months postop.    History  Smoking status  . Former Smoker -- 1.00 packs/day for 25 years  . Types: Cigarettes  . Quit date: 07/03/1982  Smokeless tobacco  . Never Used       No Known Allergies  Current Outpatient Prescriptions  Medication Sig Dispense Refill  . ALPRAZolam (XANAX) 0.25 MG tablet Take 0.25 mg by mouth daily as needed. For anxiety      . amLODipine (NORVASC) 5 MG tablet Take 1.5 tablets (7.5 mg total) by mouth daily.  135 tablet  3  . aspirin 81 MG tablet Take 81 mg by mouth daily.        Marland Kitchen atorvastatin (LIPITOR) 40 MG tablet Take 1 tablet (40 mg total) by mouth daily.  90 tablet  6  . famotidine (PEPCID) 20 MG tablet Take 20 mg by mouth daily as needed. For heartburn      . ibuprofen (ADVIL,MOTRIN) 200 MG tablet Take 200 mg by mouth every 8 (eight) hours as needed for pain.      Marland Kitchen ipratropium (ATROVENT) 0.03 % nasal spray Place 2 sprays into the nose as needed.       Marland Kitchen lisinopril (PRINIVIL,ZESTRIL) 10 MG tablet Take 1 tablet (10 mg total) by mouth daily.      .  Multiple Minerals-Vitamins (CITRACAL PLUS BONE DENSITY PO) Take 1 tablet by mouth daily.        . Multiple Vitamins-Minerals (CENTRUM SILVER PO) Take by mouth.      . tiotropium (SPIRIVA) 18 MCG inhalation capsule Place 1 capsule (18 mcg total) into inhaler and inhale daily.  90 capsule  3   No current facility-administered medications for this visit.       Physical Exam: BP 138/83  Pulse 75  Resp 16  Ht 5\' 10"  (1.778 m)  Wt 187 lb (84.823 kg)  BMI 26.83 kg/m2  SpO2 98%  General appearance: alert and cooperative Neurologic: intact Heart: regular rate and rhythm, S1, S2 normal, no murmur, click, rub or gallop and normal apical impulse Lungs: clear to auscultation bilaterally and normal percussion bilaterally Abdomen: soft, non-tender; bowel sounds normal; no masses,  no organomegaly Extremities: extremities normal, atraumatic, no cyanosis or edema and Homans sign is negative, no sign of DVT Wound: His left chest incisions are all well-healed    Diagnostic Studies & Laboratory data:         Recent Radiology Findings: Dg Chest 2 View  12/26/2012   *RADIOLOGY REPORT*  Clinical Data: Mediastinal mass resection.  CHEST - 2 VIEW  Comparison: 09/19/2012.  Findings: The cardiac silhouette, mediastinal and hilar contours are normal and stable.  The lungs are clear.  No pleural effusion or pneumothorax.  The bony thorax is intact.  IMPRESSION: No acute cardiopulmonary findings.   Original Report Authenticated By: Rudie Meyer, M.D.      Recent Labs: Lab Results  Component Value Date   WBC 10.3 09/05/2012   HGB 13.2 09/05/2012   HCT 37.9* 09/05/2012   PLT 191 09/05/2012   GLUCOSE 108* 09/05/2012   CHOL 186 10/24/2010   TRIG 59.0 10/24/2010   HDL 67.80 10/24/2010   LDLCALC 106* 10/24/2010   ALT 14 09/05/2012   AST 19 09/05/2012   NA 138 09/05/2012   K 4.2 09/05/2012   CL 105 09/05/2012   CREATININE 1.21 09/05/2012   BUN 13 09/05/2012   CO2 27 09/05/2012   INR 0.95 09/02/2012   PATH: Mediastinum, mass  resection, Anterior - THYMOMA, 6.4 CM - SEE COMMENT. Microscopic Comment The specimen reveals an encapsulated well circumscribed mass. Histologic evaluation reveals a mixed population of mature appearing lymphocytes and macrophages. Immunohistochemical stains for CD3, CD79a, CD43, CD20 and CD5, reveal that the vast majority of the cells are T cells.   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 We'll plan to see the patient back in 8 months with a followup CT scan, which will be approximately one year postop.   Sabirin Baray B 12/26/2012 10:21 AM

## 2012-12-26 NOTE — Patient Instructions (Signed)
Return in 8 months about March 2015 for CT of Chest

## 2013-01-07 ENCOUNTER — Telehealth: Payer: Self-pay | Admitting: Cardiology

## 2013-01-07 DIAGNOSIS — H6120 Impacted cerumen, unspecified ear: Secondary | ICD-10-CM | POA: Diagnosis not present

## 2013-01-07 NOTE — Telephone Encounter (Signed)
Spoke with pt, he finished out the simvastatin before he started the lipitor. This would make the fourth week on lipitor. appt made for pt to have labs in august.

## 2013-01-07 NOTE — Telephone Encounter (Signed)
NEW MESSAGE:  Pt came in and advised he had  Only been on his new cholesterol medication for 1 month but is scheduled for lab on Thurs.  He understood it should be 60 days before he is to have it checked.  Please call and advise if he needs to come in on Thurs/or let him know when he should come in for labs.  [phone]

## 2013-02-06 ENCOUNTER — Other Ambulatory Visit (INDEPENDENT_AMBULATORY_CARE_PROVIDER_SITE_OTHER): Payer: Medicare Other

## 2013-02-06 DIAGNOSIS — E785 Hyperlipidemia, unspecified: Secondary | ICD-10-CM

## 2013-02-06 LAB — LIPID PANEL: Cholesterol: 138 mg/dL (ref 0–200)

## 2013-02-06 LAB — HEPATIC FUNCTION PANEL
ALT: 21 U/L (ref 0–53)
AST: 19 U/L (ref 0–37)
Alkaline Phosphatase: 51 U/L (ref 39–117)
Bilirubin, Direct: 0.1 mg/dL (ref 0.0–0.3)
Total Bilirubin: 1.1 mg/dL (ref 0.3–1.2)
Total Protein: 6.4 g/dL (ref 6.0–8.3)

## 2013-02-07 DIAGNOSIS — L570 Actinic keratosis: Secondary | ICD-10-CM | POA: Diagnosis not present

## 2013-02-07 DIAGNOSIS — L819 Disorder of pigmentation, unspecified: Secondary | ICD-10-CM | POA: Diagnosis not present

## 2013-02-07 DIAGNOSIS — L738 Other specified follicular disorders: Secondary | ICD-10-CM | POA: Diagnosis not present

## 2013-02-07 DIAGNOSIS — L821 Other seborrheic keratosis: Secondary | ICD-10-CM | POA: Diagnosis not present

## 2013-04-19 DIAGNOSIS — Z23 Encounter for immunization: Secondary | ICD-10-CM | POA: Diagnosis not present

## 2013-05-08 ENCOUNTER — Other Ambulatory Visit: Payer: Self-pay

## 2013-05-20 DIAGNOSIS — H2510 Age-related nuclear cataract, unspecified eye: Secondary | ICD-10-CM | POA: Diagnosis not present

## 2013-05-20 DIAGNOSIS — Z961 Presence of intraocular lens: Secondary | ICD-10-CM | POA: Diagnosis not present

## 2013-05-26 DIAGNOSIS — M899 Disorder of bone, unspecified: Secondary | ICD-10-CM | POA: Diagnosis not present

## 2013-05-26 DIAGNOSIS — R82998 Other abnormal findings in urine: Secondary | ICD-10-CM | POA: Diagnosis not present

## 2013-05-26 DIAGNOSIS — I10 Essential (primary) hypertension: Secondary | ICD-10-CM | POA: Diagnosis not present

## 2013-05-26 DIAGNOSIS — E785 Hyperlipidemia, unspecified: Secondary | ICD-10-CM | POA: Diagnosis not present

## 2013-05-26 DIAGNOSIS — Z125 Encounter for screening for malignant neoplasm of prostate: Secondary | ICD-10-CM | POA: Diagnosis not present

## 2013-06-06 DIAGNOSIS — Z23 Encounter for immunization: Secondary | ICD-10-CM | POA: Diagnosis not present

## 2013-06-06 DIAGNOSIS — I10 Essential (primary) hypertension: Secondary | ICD-10-CM | POA: Diagnosis not present

## 2013-06-06 DIAGNOSIS — E785 Hyperlipidemia, unspecified: Secondary | ICD-10-CM | POA: Diagnosis not present

## 2013-06-06 DIAGNOSIS — J449 Chronic obstructive pulmonary disease, unspecified: Secondary | ICD-10-CM | POA: Diagnosis not present

## 2013-06-06 DIAGNOSIS — Z6829 Body mass index (BMI) 29.0-29.9, adult: Secondary | ICD-10-CM | POA: Diagnosis not present

## 2013-06-06 DIAGNOSIS — Z125 Encounter for screening for malignant neoplasm of prostate: Secondary | ICD-10-CM | POA: Diagnosis not present

## 2013-06-06 DIAGNOSIS — Z0000 Encounter for general adult medical examination without abnormal findings: Secondary | ICD-10-CM | POA: Diagnosis not present

## 2013-06-06 DIAGNOSIS — N529 Male erectile dysfunction, unspecified: Secondary | ICD-10-CM | POA: Diagnosis not present

## 2013-06-06 DIAGNOSIS — M899 Disorder of bone, unspecified: Secondary | ICD-10-CM | POA: Diagnosis not present

## 2013-06-06 DIAGNOSIS — Z1331 Encounter for screening for depression: Secondary | ICD-10-CM | POA: Diagnosis not present

## 2013-06-16 DIAGNOSIS — Z1212 Encounter for screening for malignant neoplasm of rectum: Secondary | ICD-10-CM | POA: Diagnosis not present

## 2013-07-25 ENCOUNTER — Other Ambulatory Visit: Payer: Self-pay | Admitting: *Deleted

## 2013-07-25 MED ORDER — TIOTROPIUM BROMIDE MONOHYDRATE 18 MCG IN CAPS: 18.0000 ug | ORAL_CAPSULE | Freq: Every day | RESPIRATORY_TRACT | Status: DC

## 2013-07-31 ENCOUNTER — Ambulatory Visit (INDEPENDENT_AMBULATORY_CARE_PROVIDER_SITE_OTHER): Payer: Medicare Other | Admitting: Cardiology

## 2013-07-31 ENCOUNTER — Encounter: Payer: Self-pay | Admitting: Cardiology

## 2013-07-31 ENCOUNTER — Ambulatory Visit (INDEPENDENT_AMBULATORY_CARE_PROVIDER_SITE_OTHER): Payer: Medicare Other | Admitting: Internal Medicine

## 2013-07-31 ENCOUNTER — Encounter: Payer: Self-pay | Admitting: Internal Medicine

## 2013-07-31 VITALS — BP 140/90 | HR 64 | Ht 70.0 in | Wt 200.8 lb

## 2013-07-31 VITALS — BP 130/82 | HR 81 | Ht 70.0 in | Wt 189.0 lb

## 2013-07-31 DIAGNOSIS — E785 Hyperlipidemia, unspecified: Secondary | ICD-10-CM | POA: Diagnosis not present

## 2013-07-31 DIAGNOSIS — J449 Chronic obstructive pulmonary disease, unspecified: Secondary | ICD-10-CM

## 2013-07-31 DIAGNOSIS — I10 Essential (primary) hypertension: Secondary | ICD-10-CM | POA: Diagnosis not present

## 2013-07-31 DIAGNOSIS — K117 Disturbances of salivary secretion: Secondary | ICD-10-CM | POA: Diagnosis not present

## 2013-07-31 DIAGNOSIS — R079 Chest pain, unspecified: Secondary | ICD-10-CM | POA: Diagnosis not present

## 2013-07-31 DIAGNOSIS — R682 Dry mouth, unspecified: Secondary | ICD-10-CM

## 2013-07-31 DIAGNOSIS — D150 Benign neoplasm of thymus: Secondary | ICD-10-CM

## 2013-07-31 DIAGNOSIS — R911 Solitary pulmonary nodule: Secondary | ICD-10-CM

## 2013-07-31 MED ORDER — ALBUTEROL SULFATE HFA 108 (90 BASE) MCG/ACT IN AERS: 2.0000 | INHALATION_SPRAY | Freq: Four times a day (QID) | RESPIRATORY_TRACT | Status: DC | PRN

## 2013-07-31 NOTE — Progress Notes (Signed)
Patient ID: Michael [name], male   DOB: [birth year], 73 y.o.   MRN: [id number] PCP: Dr. Philip Aspen  73 yo with history of HTN, hyperlipidemia, COPD, and spells of lightheadedness presents for cardiology followup. In 3/12, he had an ETT-myoview for atypical chest pain that showed no evidence for ischemia or infarction.  Since I last saw him, he had surgical resection of a thymoma.  He has mild exertional dyspnea now that he has started Spiriva for his COPD (improved).  He has had no further lightheaded spells.  No chest pain.  He is still working and is fairly active.  He can walk 2 miles without any problems.  He gets occasional muscles aches when he is especially active.  Main complaint today is dry mouth.   ECG: NSR, normal  Labs (11/11): LDL 144, HDL 68, creatinine 1.2 Labs (4/12): LDL 106, HDL 68 Labs (8/14): LDL 71, HDL 54  Allergies (verified):  No Known Drug Allergies  Past Medical History: 1. HTN 2. History of lightheadedness.  Workup for this about 1.5 years ago (Dr. Wynonia Lawman) included a 3 week event monitor and an echo, which were both unremarkable.  Of note, patient was no longer having lightheaded spells when he wore the monitor.  3.  Hyperlipidemia 4.  Colonic polyps 5.  Chest pain: ETT-myoview (3/12) with 9'30" exercise, no ischemic ECG changes, no evidence for ischemia or infarction by perfusion images, EF 64%.  6.  GERD 7. COPD 8. 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  He does help out there fairly frequently.  Smoked about 1 ppd but quit in 1984.  Married.  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   Current Outpatient Prescriptions  Medication Sig Dispense Refill  . ALPRAZolam (XANAX) 0.25 MG tablet Take 0.25 mg by mouth daily as needed. For anxiety      . amLODipine (NORVASC) 5 MG tablet Take 1.5 tablets (7.5 mg total) by mouth daily.  135 tablet  3  . aspirin 81 MG  tablet Take 81 mg by mouth daily.        Marland Kitchen atorvastatin (LIPITOR) 40 MG tablet Take 1 tablet (40 mg total) by mouth daily.  90 tablet  6  . famotidine (PEPCID) 20 MG tablet Take 20 mg by mouth daily as needed. For heartburn      . ibuprofen (ADVIL,MOTRIN) 200 MG tablet Take 200 mg by mouth every 8 (eight) hours as needed for pain.      Marland Kitchen ipratropium (ATROVENT) 0.03 % nasal spray Place 2 sprays into the nose as needed.       Marland Kitchen lisinopril (PRINIVIL,ZESTRIL) 10 MG tablet Take 1 tablet (10 mg total) by mouth daily.      . Multiple Minerals-Vitamins (CITRACAL PLUS BONE DENSITY PO) Take 1 tablet by mouth daily.        . Multiple Vitamins-Minerals (CENTRUM SILVER PO) Take by mouth.      . tiotropium (SPIRIVA) 18 MCG inhalation capsule Place 1 capsule (18 mcg total) into inhaler and inhale daily.  90 capsule  0  . albuterol (PROVENTIL HFA;VENTOLIN HFA) 108 (90 BASE) MCG/ACT inhaler Inhale 2 puffs into the lungs every 6 (six) hours as needed for wheezing or shortness of breath.  1 Inhaler  6  . Coenzyme Q10 200 MG TABS 1 tablet daily    0   No current facility-administered medications for this visit.  BP 140/90  Pulse 64  Ht 5\' 10"  (1.778 m)  Wt 91.082 kg (200 lb 12.8 oz)  BMI 28.81 kg/m2 General:  Well developed, well nourished, in no acute distress. Overweight.  Neck:  Neck supple, no JVD. No masses, thyromegaly or abnormal cervical nodes. Lungs:  Clear bilaterally to auscultation and percussion. Heart:  Non-displaced PMI, chest non-tender; regular rate and rhythm, S1, S2 without murmurs, rubs or gallops. Carotid upstroke normal, no bruit.  Pedals normal pulses. No edema, no varicosities. Abdomen:  Bowel sounds positive; abdomen soft and non-tender without masses, organomegaly, or hernias noted. No hepatosplenomegaly. Extremities:  No clubbing or cyanosis. Neurologic:  Alert and oriented x 3. Psych:  Normal affect.  Assessment/Plan: 1. Chest pain: Very atypical, rare, and seems likely to be  due to GERD.  He had a normal ETT-myoview in 3/12.  Continue ASA 81.  2. Hyperlipidemia:Good lipids in 8/14.  Continue atorvastatin at current dose. Occasional achiness is not very bothersome but could be statin-related.  I suggested that he try coenzyme Q10 200 mg daily.    3. HTN: BP has been under good control.  4. COPD: Patient has been diagnosed by PFTs and has had improvement in dyspnea with Spiriva. I think that his dry mouth is likely related to the Spiriva.  He is going to see Dr. Chase Caller later today and can discuss this with him.  Could consider a different inhaler.    Loralie Champagne 07/31/2013

## 2013-07-31 NOTE — Patient Instructions (Addendum)
#  Dry mouth   - stop spiriva for 3 weeks   #Mild emphysema  - currently stable  - due to above issue, stop spiriva.   - use albuterol 2 puff as needed; take script 0- if you get worse, call us  #Lung nodule and thymoma followup  - do CT scan chest with contrast mid feb 2015  #Followup  3weeks to assess followup of above issues

## 2013-07-31 NOTE — Progress Notes (Signed)
Subjective:    Patient ID: Michael [name], male    DOB: January 02, 1941, 73 y.o.   MRN: [id number]  HPI   #Thymoma stage I  - Diagnosed during low-dose CT scan of the chest screening program for lung cancer - Status post complete resection without residual disease September 03, 2012 - Followup Dr Servando Snare  #COPD mild, Gold stage I versus mild asthma - COPD cat score 4 on 10/08/2012  - Pulmonary function test done February 2014: Prebronchodilator FEV1 while on Spiriva 2.4 L/76%. Postbronchodilator FEV1 2.6 L/82% with FEV1 FEC ratio of 69 prebronchodilator and 76 post bronchodilator. Diffusion capacity 90%   #Pulmonary nodule - 3 mm lung nodule right upper lobe and left upper lobe diagnosed February 2014 during CT scan chest screening program for lung cancer   OV 10/08/2012  Followup for all of the above. He is now status post resection of thymoma which is now stage I. Looks like she will have annual surveillance CT scan of the chest with Dr. Servando Snare He is doing well. In retrospect he recollects retrosternal pain that is now completely resolved after surgery. He also does have some fatigue which is better. He denies any myasthenia symptoms. He continues with Spiriva and he finds good relief. I reviewed his pulmonary function test and is more suggestive of asthma because of normal diffusion capacity but he is content taking Spiriva and does not want to switch.  Currently he is putting his life back together and wants to focus on fitness by joining the [facility]  There are no new issues   OV 07/31/2013  Chief Complaint  Patient presents with  . Follow-up    Pt states he feels SOB is improved since last OV. Pt c/o having some PND, and dry mouth.     Followup for  - Emphysema mild: Currently stable. He is using Spiriva which he likes except for the fact that it might be associated with dry mouth [see below]. COPD cat score is 10 and reflects mild symptom burden.   - Lung nodule: He is due for  his serial CT scan of the chest in mid February 2015. This will mark 1 year followup  - Thymoma: Stage I. Status post resection. He is due for his one-year CT scan of the chest but February 2015 and this needs to be arranged  -- New complaint: Started Spiriva 89 months ago. However for the last 4 months even in the fall in the summer started noticing severe dry mouth especially at night. This is not associated with dry eyes. It is there daily. It is not progressive. It is relieved by drinking water. It is associated with Spiriva intake. His nose is her autoimmune symptoms or any other associated symptoms. This no radiation Review of Systems  Constitutional: Negative for fever and unexpected weight change.  HENT: Positive for postnasal drip. Negative for congestion, dental problem, ear pain, nosebleeds, rhinorrhea, sinus pressure, sneezing, sore throat and trouble swallowing.   Eyes: Negative for redness and itching.  Respiratory: Positive for shortness of breath. Negative for cough, chest tightness and wheezing.   Cardiovascular: Negative for palpitations and leg swelling.  Gastrointestinal: Negative for nausea and vomiting.  Genitourinary: Negative for dysuria.  Musculoskeletal: Negative for joint swelling.  Skin: Negative for rash.  Neurological: Negative for headaches.  Hematological: Does not bruise/bleed easily.  Psychiatric/Behavioral: Negative for dysphoric mood. The patient is not nervous/anxious.   Current outpatient prescriptions:ALPRAZolam (XANAX) 0.25 MG tablet, Take 0.25 mg by mouth daily as needed.  For anxiety, Disp: , Rfl: ;  amLODipine (NORVASC) 5 MG tablet, Take 1.5 tablets (7.5 mg total) by mouth daily., Disp: 135 tablet, Rfl: 3;  aspirin 81 MG tablet, Take 81 mg by mouth daily.  , Disp: , Rfl: ;  atorvastatin (LIPITOR) 40 MG tablet, Take 1 tablet (40 mg total) by mouth daily., Disp: 90 tablet, Rfl: 6 Coenzyme Q10 200 MG TABS, 1 tablet daily, Disp: , Rfl: 0;  famotidine (PEPCID)  20 MG tablet, Take 20 mg by mouth daily as needed. For heartburn, Disp: , Rfl: ;  ibuprofen (ADVIL,MOTRIN) 200 MG tablet, Take 200 mg by mouth every 8 (eight) hours as needed for pain., Disp: , Rfl: ;  ipratropium (ATROVENT) 0.03 % nasal spray, Place 2 sprays into the nose as needed. , Disp: , Rfl:  lisinopril (PRINIVIL,ZESTRIL) 10 MG tablet, Take 1 tablet (10 mg total) by mouth daily., Disp: , Rfl: ;  Multiple Minerals-Vitamins (CITRACAL PLUS BONE DENSITY PO), Take 1 tablet by mouth daily.  , Disp: , Rfl: ;  Multiple Vitamins-Minerals (CENTRUM SILVER PO), Take by mouth., Disp: , Rfl: ;  tiotropium (SPIRIVA) 18 MCG inhalation capsule, Place 1 capsule (18 mcg total) into inhaler and inhale daily., Disp: 90 capsule, Rfl: 0      Objective:   Physical Exam  Nursing note and vitals reviewed. Constitutional: He is oriented to person, place, and time. He appears well-developed and well-nourished. No distress.  HENT:  Head: Normocephalic and atraumatic.  Right Ear: External ear normal.  Left Ear: External ear normal.  Mouth/Throat: Oropharynx is clear and moist. No oropharyngeal exudate.  Eyes: Conjunctivae and EOM are normal. Pupils are equal, round, and reactive to light. Right eye exhibits no discharge. Left eye exhibits no discharge. No scleral icterus.  Neck: Normal range of motion. Neck supple. No JVD present. No tracheal deviation present. No thyromegaly present.  Cardiovascular: Normal rate, regular rhythm and intact distal pulses.  Exam reveals no gallop and no friction rub.   No murmur heard. Pulmonary/Chest: Effort normal and breath sounds normal. No respiratory distress. He has no wheezes. He has no rales. He exhibits no tenderness.  Abdominal: Soft. Bowel sounds are normal. He exhibits no distension and no mass. There is no tenderness. There is no rebound and no guarding.  Musculoskeletal: Normal range of motion. He exhibits no edema and no tenderness.  Lymphadenopathy:    He has no  cervical adenopathy.  Neurological: He is alert and oriented to person, place, and time. He has normal reflexes. No cranial nerve deficit. Coordination normal.  Skin: Skin is warm and dry. No rash noted. He is not diaphoretic. No erythema. No pallor.  Psychiatric: He has a normal mood and affect. His behavior is normal. Judgment and thought content normal.          Assessment & Plan:

## 2013-07-31 NOTE — Patient Instructions (Signed)
Try coenzyme Q10 200mg  daily.  Your physician wants you to follow-up in: 1 year with Dr Aundra Dubin. (January 2016). You will receive a reminder letter in the mail two months in advance. If you don't receive a letter, please call our office to schedule the follow-up appointment.

## 2013-08-03 DIAGNOSIS — R682 Dry mouth, unspecified: Secondary | ICD-10-CM | POA: Insufficient documentation

## 2013-08-03 NOTE — Assessment & Plan Note (Signed)
#  Lung nodule and thymoma followup  - do CT scan chest with contrast mid feb 2015

## 2013-08-03 NOTE — Assessment & Plan Note (Signed)
#  Mild emphysema/COPD stage 1  - currently stable  - due to dry mouth issue, stop spiriva.   - use albuterol 2 puff as needed; take script - if you get worse, call us #Followup  3weeks to assess followup of above issues

## 2013-08-03 NOTE — Assessment & Plan Note (Signed)
?   Due to spiriva. Unable to id different etiology on hisotiry or exam. Temporally correlated with spiriva. Is severe enough is bothering him.  Will dc spriva for 3 weeks and reassess. If hypothesis true, will try another LAMA or an ICS  Or LABA (last least preferred option)

## 2013-08-05 ENCOUNTER — Telehealth: Payer: Self-pay | Admitting: Internal Medicine

## 2013-08-05 NOTE — Telephone Encounter (Signed)
Returning call can be reached at [phone].Michael [name]

## 2013-08-05 NOTE — Telephone Encounter (Signed)
Spoke with pt about his questionairre. Reports still having dry mouth after being off Spiriva for 5 days. Has been having SOB, chest congestion and PNA at night for the last 3-5 weeks. Doesn't seem to be related to any kind of cold per the pt. He is willing to continue the treatment plan that he and MR have discussed. Pt will call us back if things get worse or change in any way.

## 2013-08-05 NOTE — Telephone Encounter (Signed)
lmomtcb x1 for pt w/ family member

## 2013-08-05 NOTE — Telephone Encounter (Signed)
Questionnaire copied below:    [name] >>       Questionnaire Submission    Michael [name]      Sent: Tue August 05, 2013 10:42 AM        Michael [name]    MRN: [id number] DOB: 1940/07/04     Pt Work: [phone] Pt Home: [phone]    Entered: [phone]           Current View: Showing all answers Show Only Relevant Answers    Legend: Scores, Non-relevant Questions     Patient Responses     [name] Mychart After Visit Questionnaire    Question 08/05/2013 10:42 AM    How are you feeling after your recent visit? Starting to feel a little congestion in chest and shortness of breath    Does the recommended course of treatment seem to be helping your symptoms? Dry mouth still continues; maybe a little less in severity.    Are you experiencing any side effects from your recommended treatment? CoQ10 prescribed by cardiologist seems to be helping body aches; I like it.    is there anything else you would like to ask your physician? I'm still game to hold off on the Spiriva for another week, but if the dry mouth does not improve at that point, will want to discuss going back on Spiriva and / or another medication for COPD.      Message    Patient Questionnaire Submission    --------------------------------         Questionnaire: Questionnaire         Question: How are you feeling after your recent visit?    Answer:   Starting to feel a little congestion in chest and shortness of breath         Question: Does the recommended course of treatment seem to be helping your symptoms?    Answer:   Dry mouth still continues; maybe a little less in severity.         Question: Are you experiencing any side effects from your recommended treatment?    Answer:   CoQ10 prescribed by cardiologist seems to be helping body aches; I like it.         Question: is there anything else you would like to ask your physician?    Answer:   I'm still game to  hold off on the Spiriva for another week, but if the dry mouth does not improve at that point, will want to discuss going back on Spiriva and / or another medication for COPD.         ----- Message -----       From: Brand Males, MD       Sent: 08/03/2013  7:21 AM         To: Michael [name]    Subject: Questionnaire          To ensure we are providing you the highest quality healthcare, we'd like to know how you are feeling after your recent visit. At your earliest convenience, please complete the brief follow-up assessment by clicking the Task: Questionnaire link listed above.         Thank you for your time in helping [place] improve our services and for partnering with [place] in your wellness and care.         Sincerely,         Your Care Team

## 2013-08-13 ENCOUNTER — Encounter: Payer: Self-pay | Admitting: Internal Medicine

## 2013-08-13 NOTE — Telephone Encounter (Signed)
Pt has stopped Spiriva due to dry mouth as advised at last OV 07-31-13. He states stopping the spiriva has not helped his dry mouth and he is now having increased SOB, and cough. Pt is wanting to know is it ok to start back on the Spiriva? Please advise.[place], CMA

## 2013-08-20 ENCOUNTER — Ambulatory Visit (INDEPENDENT_AMBULATORY_CARE_PROVIDER_SITE_OTHER)
Admission: RE | Admit: 2013-08-20 | Discharge: 2013-08-20 | Disposition: A | Discharge status: Home / Self Care | Payer: Medicare Other | Source: Ambulatory Visit | Attending: Internal Medicine | Admitting: Internal Medicine

## 2013-08-20 DIAGNOSIS — J984 Other disorders of lung: Secondary | ICD-10-CM | POA: Diagnosis not present

## 2013-08-20 DIAGNOSIS — R911 Solitary pulmonary nodule: Secondary | ICD-10-CM

## 2013-08-22 ENCOUNTER — Ambulatory Visit (INDEPENDENT_AMBULATORY_CARE_PROVIDER_SITE_OTHER): Payer: Medicare Other | Admitting: Internal Medicine

## 2013-08-22 ENCOUNTER — Telehealth: Payer: Self-pay | Admitting: Internal Medicine

## 2013-08-22 ENCOUNTER — Encounter: Payer: Self-pay | Admitting: Internal Medicine

## 2013-08-22 VITALS — BP 112/80 | HR 80 | Ht 70.0 in | Wt 186.0 lb

## 2013-08-22 DIAGNOSIS — D150 Benign neoplasm of thymus: Secondary | ICD-10-CM

## 2013-08-22 DIAGNOSIS — R911 Solitary pulmonary nodule: Secondary | ICD-10-CM

## 2013-08-22 DIAGNOSIS — R682 Dry mouth, unspecified: Secondary | ICD-10-CM

## 2013-08-22 DIAGNOSIS — J449 Chronic obstructive pulmonary disease, unspecified: Secondary | ICD-10-CM | POA: Diagnosis not present

## 2013-08-22 DIAGNOSIS — K8689 Other specified diseases of pancreas: Secondary | ICD-10-CM

## 2013-08-22 DIAGNOSIS — K117 Disturbances of salivary secretion: Secondary | ICD-10-CM

## 2013-08-22 DIAGNOSIS — K869 Disease of pancreas, unspecified: Secondary | ICD-10-CM

## 2013-08-22 NOTE — Telephone Encounter (Signed)
Order placed. I [id number] to advise the pt. [place] Bing, CMA

## 2013-08-22 NOTE — Telephone Encounter (Signed)
Hi Michael [name]  Can you look at this pancreas tail on recent CT and give me a sense of what you think and what imaging he needs? He is an established patienty of yours per his history. Based on what you say, we can order the test. PAtient Michael [name] feels best we coordinate and take care of this  Thanks  Dr. Brand Males, M.D., [facility].C.P Pulmonary and Critical Care Medicine Staff Physician [place] [facility] Pager: [phone], If no answer or between  15:00h - 7:00h: call [phone]  08/22/2013 11:30 AM     Ct Chest Wo Contrast  08/20/2013   CLINICAL DATA:  Routine followup lung nodule  EXAM: CT CHEST WITHOUT CONTRAST  TECHNIQUE: Multidetector CT imaging of the chest was performed following the standard protocol without IV contrast.  COMPARISON:  08/19/2012  FINDINGS: No pleural effusion identified. There is no airspace consolidation. Scarring noted within the posterior lung bases. Scar is identified within the left upper lobe, image 19/series 3 stable tiny left upper lobe nodule measuring 3 mm, image 18/series 3. Right upper lobe nodule measures 3 mm and is unchanged from previous exam, image 22/series 3. No new or enlarging pulmonary nodules or masses. 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 No evidence for recurrence of tumor. Calcified atherosclerotic change involves the thoracic aorta. There is no aneurysm. There is also a calcification within the LAD coronary artery. No mediastinal or hilar adenopathy noted. There is no axillary or supraclavicular adenopathy.  Incidental imaging through the upper abdomen shows no acute findings. However, there is a change in the appearance of the distal tail of pancreas. An area of soft tissue fullness and mild peripheral and distinctness measures 2.5 cm, image 57/series 2.  IMPRESSION: 1. Stable small pulmonary nodules which are likely benign. 2. Status post resection of anterior mediastinal  mass without evidence for recurrence of tumor. 3. Interval change in the appearance of the distal tail of pancreas which now exhibits abnormal soft tissue fullness and mild peripheral indistinctness. Underlying pancreatic mass cannot be excluded on this noncontrast CT through the upper abdomen. Recommend further evaluation with contrast enhanced CT or MRI of the pancreas. These results will be called to the ordering clinician or representative by the Radiologist Assistant, and communication documented in the PACS Dashboard. .   Electronically Signed   By: Kerby Moors M.D.   On: 08/20/2013 15:59

## 2013-08-22 NOTE — Telephone Encounter (Signed)
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 Brand Males, M.D., [facility].C.P Pulmonary and Critical Care Medicine Staff Physician [place] [facility] Pager: [phone], If no answer or between  15:00h - 7:00h: call [phone]  08/22/2013 11:40 AM    Ct Chest Wo Contrast  08/20/2013   CLINICAL DATA:  Routine followup lung nodule  EXAM: CT CHEST WITHOUT CONTRAST  TECHNIQUE: Multidetector CT imaging of the chest was performed following the standard protocol without IV contrast.  COMPARISON:  08/19/2012  FINDINGS: No pleural effusion identified. There is no airspace consolidation. Scarring noted within the posterior lung bases. Scar is identified within the left upper lobe, image 19/series 3 stable tiny left upper lobe nodule measuring 3 mm, image 18/series 3. Right upper lobe nodule measures 3 mm and is unchanged from previous exam, image 22/series 3. No new or enlarging pulmonary nodules or masses. 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 No evidence for recurrence of tumor. Calcified atherosclerotic change involves the thoracic aorta. There is no aneurysm. There is also a calcification within the LAD coronary artery. No mediastinal or hilar adenopathy noted. There is no axillary or supraclavicular adenopathy.  Incidental imaging through the upper abdomen shows no acute findings. However, there is a change in the appearance of the distal tail of pancreas. An area of soft tissue fullness and mild peripheral and distinctness measures 2.5 cm, image 57/series 2.  IMPRESSION: 1. Stable small pulmonary nodules which are likely benign. 2. Status post resection of anterior mediastinal mass without evidence for recurrence of tumor. 3. Interval change in the appearance of the distal tail of pancreas which now exhibits abnormal soft tissue fullness and mild peripheral indistinctness. Underlying  pancreatic mass cannot be excluded on this noncontrast CT through the upper abdomen. Recommend further evaluation with contrast enhanced CT or MRI of the pancreas. These results will be called to the ordering clinician or representative by the Radiologist Assistant, and communication documented in the PACS Dashboard. .   Electronically Signed   By: Kerby Moors M.D.   On: 08/20/2013 15:59

## 2013-08-22 NOTE — Assessment & Plan Note (Signed)
Seen tail of pancreas 08/20/13 CT chest. D/w DR Henrene Pastor: get MRI and if abnormal refer to Dr [name] for EUS

## 2013-08-22 NOTE — Assessment & Plan Note (Signed)
#  Dry mouth   - glad some better with biotene; so continue that - agree that you should see your dentist

## 2013-08-22 NOTE — Assessment & Plan Note (Addendum)
#  Lung nodule and thymoma followup  - no recurrence  CT scan chest with contrast mid feb 2015 - do next CT chest wo contrast feb 2016   > 50% of this > 25 min visit spent in face to face counseling (15 min visit converted to 25 min)

## 2013-08-22 NOTE — Telephone Encounter (Signed)
Michael [name], I would recommend proceeding with MRI of the pancreas to evaluate the area in question further. If there is still a question thereafter, I would have [name] see him for possible EUS and biopsy. Thanks

## 2013-08-22 NOTE — Telephone Encounter (Signed)
[name] (copy to Dr Henrene Pastor)  Please order MRI Abdomen with and without contrast.; Indication: pancreatic mass.  Please let patient Michael [name]  know. Based on results I will advise him  Dr. Brand Males, M.D., [facility].C.P Pulmonary and Critical Care Medicine Staff Physician [place] [facility] Pager: [phone], If no answer or between  15:00h - 7:00h: call [phone]  08/22/2013 2:49 PM

## 2013-08-22 NOTE — Assessment & Plan Note (Signed)
  #  Mild emphysema  - currently stable  - continue  spiriva.   - use albuterol 2 puff as needed; take script - if you get worse, call us   #Followup  - await our call in a few to several days  - otherwise, 9 months for copd

## 2013-08-22 NOTE — Progress Notes (Signed)
Subjective:    Patient ID: Michael [name], male    DOB: [birth year], 73 y.o.   MRN: [id number]  HPI  #Thymoma stage I  - Diagnosed during low-dose CT scan of the chest screening program for lung cancer - Status post complete resection without residual disease September 03, 2012 - Followup Dr Servando Snare  #COPD mild, Gold stage I versus mild asthma - COPD cat score 4 on 10/08/2012  - Pulmonary function test done February 2014: Prebronchodilator FEV1 while on Spiriva 2.4 L/76%. Postbronchodilator FEV1 2.6 L/82% with FEV1 FEC ratio of 69 prebronchodilator and 76 post bronchodilator. Diffusion capacity 90%   #Pulmonary nodule - 3 mm lung nodule right upper lobe and left upper lobe diagnosed February 2014 during CT scan chest screening program for lung cancer   OV 10/08/2012  Followup for all of the above. He is now status post resection of thymoma which is now stage I. Looks like she will have annual surveillance CT scan of the chest with Dr. Servando Snare He is doing well. In retrospect he recollects retrosternal pain that is now completely resolved after surgery. He also does have some fatigue which is better. He denies any myasthenia symptoms. He continues with Spiriva and he finds good relief. I reviewed his pulmonary function test and is more suggestive of asthma because of normal diffusion capacity but he is content taking Spiriva and does not want to switch.  Currently he is putting his life back together and wants to focus on fitness by joining the [facility] [employer]  There are no new issues   OV 07/31/2013  Chief Complaint  Patient presents with  . Follow-up    Pt states he feels SOB is improved since last OV. Pt c/o having some PND, and dry mouth.     Followup for  - Emphysema mild: Currently stable. He is using Spiriva which he likes except for the fact that it might be associated with dry mouth [see below]. COPD cat score is 10 and reflects mild symptom burden.   - Lung nodule: He is due for his  serial CT scan of the chest in mid February 2015. This will mark 1 year followup  - Thymoma: Stage I. Status post resection. He is due for his one-year CT scan of the chest but February 2015 and this needs to be arranged  -- New complaint: Started Spiriva 8-9 months ago. However for the last 4 months even in the fall in the summer started noticing severe dry mouth especially at night. This is not associated with dry eyes. It is there daily. It is not progressive. It is relieved by drinking water. It is associated with Spiriva intake. His nose is her autoimmune symptoms or any other associated symptoms. This no radiation s    REC #Dry mouth   - stop spiriva for 3 weeks   #Mild emphysema  - currently stable  - due to above issue, stop spiriva.   - use albuterol 2 puff as needed; take script 0- if you get worse, call us  #Lung nodule and thymoma followup  - do CT scan chest with contrast mid feb 2015  #Followup  3weeks to assess followup of above issues  OV 08/22/2013  Chief Complaint  Patient presents with  . Follow-up    to review CT results. Pt states cough and sob has improved with spiriva.     Fu several issues  - dry mouth: did not improve after dc spiriva. So went back on spiriva. Advised biotene  mouth wash and he is better. He is due to see dentist.   - emphysema: got worse after he stopped spiriva but improved and back to baseline after he went back on spiriva  - lung nodule and thymoma; had 1 year CT chest that shows no recurence and nodule stability  - New issue: CT chest feb 2015 shows possible mass lesion tail of pancreas and more advnaced imaging recommended. He prefers I d/w Dr Henrene Pastor his GI first before ordering advanced imaging   Ct Chest Wo Contrast  08/20/2013   CLINICAL DATA:  Routine followup lung nodule  EXAM: CT CHEST WITHOUT CONTRAST  TECHNIQUE: Multidetector CT imaging of the chest was performed following the standard protocol without IV contrast.   COMPARISON:  08/19/2012  FINDINGS: No pleural effusion identified. There is no airspace consolidation. Scarring noted within the posterior lung bases. Scar is identified within the left upper lobe, image 19/series 3 stable tiny left upper lobe nodule measuring 3 mm, image 18/series 3. Right upper lobe nodule measures 3 mm and is unchanged from previous exam, image 22/series 3. No new or enlarging pulmonary nodules or masses. 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 No evidence for recurrence of tumor. Calcified atherosclerotic change involves the thoracic aorta. There is no aneurysm. There is also a calcification within the LAD coronary artery. No mediastinal or hilar adenopathy noted. There is no axillary or supraclavicular adenopathy.  Incidental imaging through the upper abdomen shows no acute findings. However, there is a change in the appearance of the distal tail of pancreas. An area of soft tissue fullness and mild peripheral and distinctness measures 2.5 cm, image 57/series 2.  IMPRESSION: 1. Stable small pulmonary nodules which are likely benign. 2. Status post resection of anterior mediastinal mass without evidence for recurrence of tumor. 3. Interval change in the appearance of the distal tail of pancreas which now exhibits abnormal soft tissue fullness and mild peripheral indistinctness. Underlying pancreatic mass cannot be excluded on this noncontrast CT through the upper abdomen. Recommend further evaluation with contrast enhanced CT or MRI of the pancreas. These results will be called to the ordering clinician or representative by the Radiologist Assistant, and communication documented in the PACS Dashboard. .   Electronically Signed   By: Kerby Moors M.D.   On: 08/20/2013 15:59     Review of Systems  Constitutional: Negative for fever and unexpected weight change.  HENT: Positive for postnasal drip. Negative for congestion, dental problem, ear pain, nosebleeds,  rhinorrhea, sinus pressure, sneezing, sore throat and trouble swallowing.   Eyes: Negative for redness and itching.  Respiratory: Positive for cough and shortness of breath. Negative for chest tightness and wheezing.   Cardiovascular: Negative for palpitations and leg swelling.  Gastrointestinal: Negative for nausea and vomiting.  Genitourinary: Negative for dysuria.  Musculoskeletal: Negative for joint swelling.  Skin: Negative for rash.  Neurological: Negative for headaches.  Hematological: Does not bruise/bleed easily.  Psychiatric/Behavioral: Negative for dysphoric mood. The patient is not nervous/anxious.        Objective:   Physical Exam  Filed Vitals:   08/22/13 1101  BP: 112/80  Pulse: 80  Height: 5\' 10"  (1.778 m)  Weight: 186 lb (84.369 kg)  SpO2: 97%   Discussion only visit      Assessment & Plan:  #Dry mouth   - glad some better with biotene; so continue that - agree that you should see your dentist   #Mild emphysema  - currently stable  -  continue  spiriva.   - use albuterol 2 puff as needed; take script - if you get worse, call us  #Lung nodule and thymoma followup  - no recurrence  CT scan chest with contrast mid feb 2015 - do next CT chest wo contrast feb 2016  #Possible lesion in pancreas tail on CT chest feb 2015  - I will coordinate with Dr Henrene Pastor and get back to you  #Followup  - await our call in a few to several days  - otherwise, 9 months for copd  > 50% of this > 25 min visit spent in face to face counseling (15 min visit converted to 25 min)

## 2013-08-22 NOTE — Patient Instructions (Signed)
#  Dry mouth   - glad some better with biotene; so continue that - agree that you should see your dentist   #Mild emphysema  - currently stable  - continue  spiriva.   - use albuterol 2 puff as needed; take script - if you get worse, call us  #Lung nodule and thymoma followup  - no recurrence  CT scan chest with contrast mid feb 2015 - do next CT chest wo contrast feb 2016  #Possible lesion in pancreas tail on CT chest feb 2015  - I will coordinate with Dr Henrene Pastor and get back to you  #Followup  - await our call in a few to several days  - otherwise, 9 months for copd

## 2013-08-24 ENCOUNTER — Encounter: Payer: Self-pay | Admitting: Internal Medicine

## 2013-08-25 NOTE — Telephone Encounter (Signed)
See pt email 08/25/13. Pt advised

## 2013-08-31 IMAGING — PT NM PET TUM IMG INITIAL (PI) SKULL BASE T - THIGH
6 series · 25 of 25 positions shown · non-contrast
Comparison: CT thorax 08/19/2012

CLINICAL DATA: Initial treatment strategy for anterior mediastinal
mass.

NUCLEAR MEDICINE PET SKULL BASE TO THIGH
Fasting Blood Glucose:  106
TECHNIQUE: 19.8  mCi F-18 FDG was injected intravenously. CT data
was obtained and used for attenuation correction and anatomic
localization only.  (This was not acquired as a diagnostic CT
examination.) Additional exam technical data entered on
technologist worksheet.

[Series 1: pet ac · axial · 3.3mm · 4.69mm/px · z∈[-870,+0]mm · 5 of 267 slices shown]
[im 1/267]
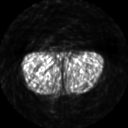
[im 67/267]
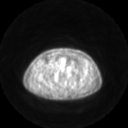
[im 134/267]
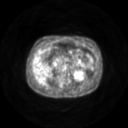
[im 200/267]
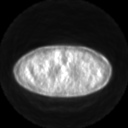
[im 267/267]
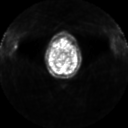

[Series 2: ct images · axial · 3.8mm · 0.98mm/px · z∈[-857,+0]mm · 6 of 263 slices shown]
[im 1/263]
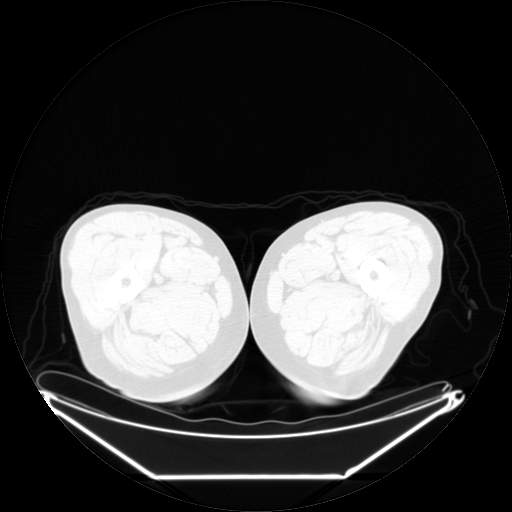
[im 53/263]
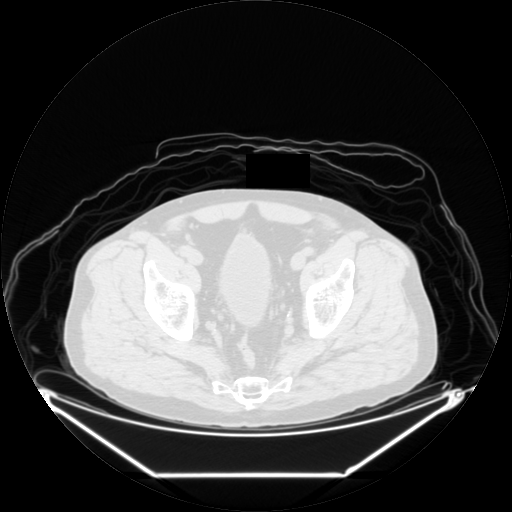
[im 105/263]
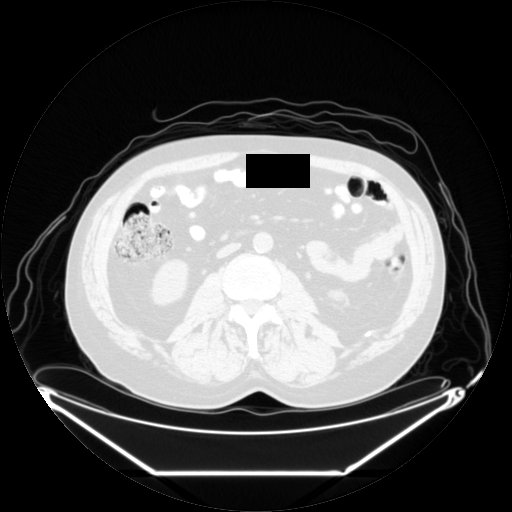
[im 158/263]
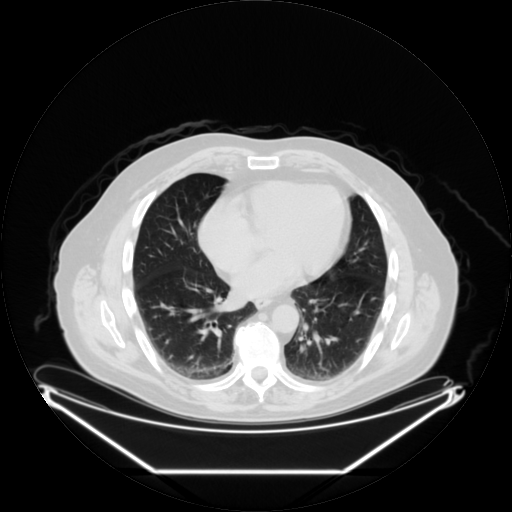
[im 210/263]
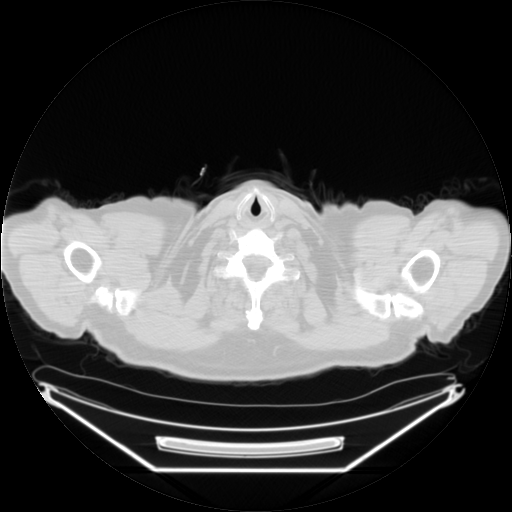
[im 263/263  brain]
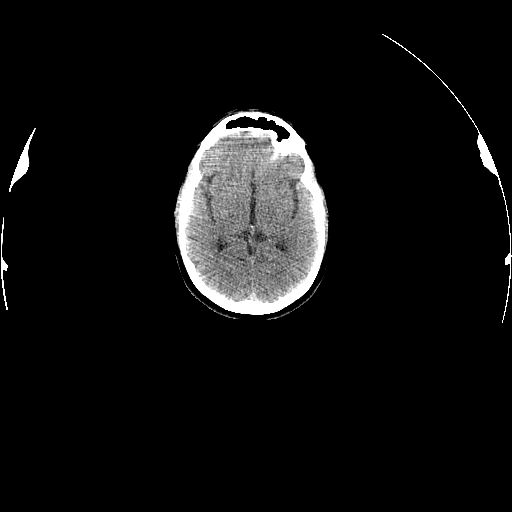

[Series 2: pet nac · axial · 3.3mm · 4.69mm/px · z∈[-870,+0]mm · 5 of 267 slices shown]
[im 1/267]
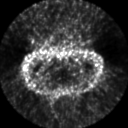
[im 67/267]
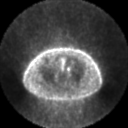
[im 134/267]
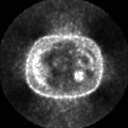
[im 200/267]
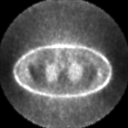
[im 267/267]
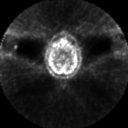

[Series 123: mip · coronal · 3.3mm · 4.69mm/px · 1 of 29 slices shown]
[im 1/29]
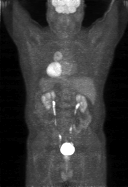

[Series 151: reformatted · axial · 3.3mm · 3.91mm/px · z∈[-870,+0]mm · 6 of 267 slices shown (1 of 2)]
[im 1/267]
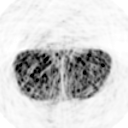
[im 54/267]
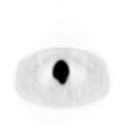
[im 107/267]
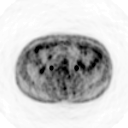
[im 160/267]
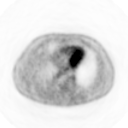
[im 213/267]
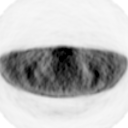
[im 267/267]
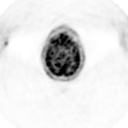

[Series 153: reformatted · coronal · 4.7mm · 6.98mm/px · 2 of 83 slices shown (2 of 2)]
[im 1/83]
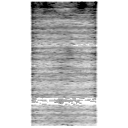
[im 83/83]
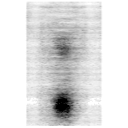

[25 of 25 positions shown; findings below may reference images not displayed]

FINDINGS: Neck: No hypermetabolic lymph nodes in the neck.

Chest:  Within the anterior mediastinum there is a rounded 5.3 x
3.9 cm mass which is heterogeneous in density on noncontrast CT.
The portion of the lesion which is more dense has intense metabolic
activity with SUV max = 8.2.  No additional hypermetabolic masses
or nodes in the mediastinum.  No hypermetabolic pulmonary nodules.

Abdomen/Pelvis:  No abnormal hypermetabolic activity within the
liver, pancreas, adrenal glands, or spleen.  No hypermetabolic
lymph nodes in the abdomen or pelvis.

Skeleton:  No focal hypermetabolic activity to suggest skeletal
metastasis.
IMPRESSION: 1..  Hypermetabolic rounded anterior mediastinal mass is consistent
with benign or malignant neoplasm.  Recommend biopsy versus
surgical resection.
2.  No evidence of local nodal metastasis or distant metastasis.

## 2013-09-02 ENCOUNTER — Other Ambulatory Visit: Payer: Self-pay | Admitting: *Deleted

## 2013-09-02 ENCOUNTER — Other Ambulatory Visit: Payer: Self-pay

## 2013-09-02 MED ORDER — AMLODIPINE BESYLATE 5 MG PO TABS: 7.5000 mg | ORAL_TABLET | Freq: Every day | ORAL | Status: DC

## 2013-09-02 MED ORDER — LISINOPRIL 10 MG PO TABS: 10.0000 mg | ORAL_TABLET | Freq: Every day | ORAL | Status: DC

## 2013-09-02 MED ORDER — ATORVASTATIN CALCIUM 40 MG PO TABS: 40.0000 mg | ORAL_TABLET | Freq: Every day | ORAL | Status: DC

## 2013-09-04 ENCOUNTER — Other Ambulatory Visit: Payer: Self-pay

## 2013-09-04 ENCOUNTER — Ambulatory Visit (HOSPITAL_COMMUNITY)
Admission: RE | Admit: 2013-09-04 | Discharge: 2013-09-04 | Disposition: A | Discharge status: Home / Self Care | Payer: Medicare Other | Source: Ambulatory Visit | Attending: Internal Medicine | Admitting: Internal Medicine

## 2013-09-04 ENCOUNTER — Other Ambulatory Visit: Payer: Self-pay | Admitting: Internal Medicine

## 2013-09-04 DIAGNOSIS — K8689 Other specified diseases of pancreas: Secondary | ICD-10-CM

## 2013-09-04 DIAGNOSIS — D150 Benign neoplasm of thymus: Secondary | ICD-10-CM

## 2013-09-04 DIAGNOSIS — K869 Disease of pancreas, unspecified: Secondary | ICD-10-CM | POA: Diagnosis not present

## 2013-09-04 IMAGING — CR DG CHEST 2V
2 series · 2 of 2 positions shown · non-contrast
Comparison: PET CT 08/29/2012.

CLINICAL DATA: Preop for VATS procedure. Mediastinal mass.

CHEST - 2 VIEW

[view not recorded (1 of 2)]
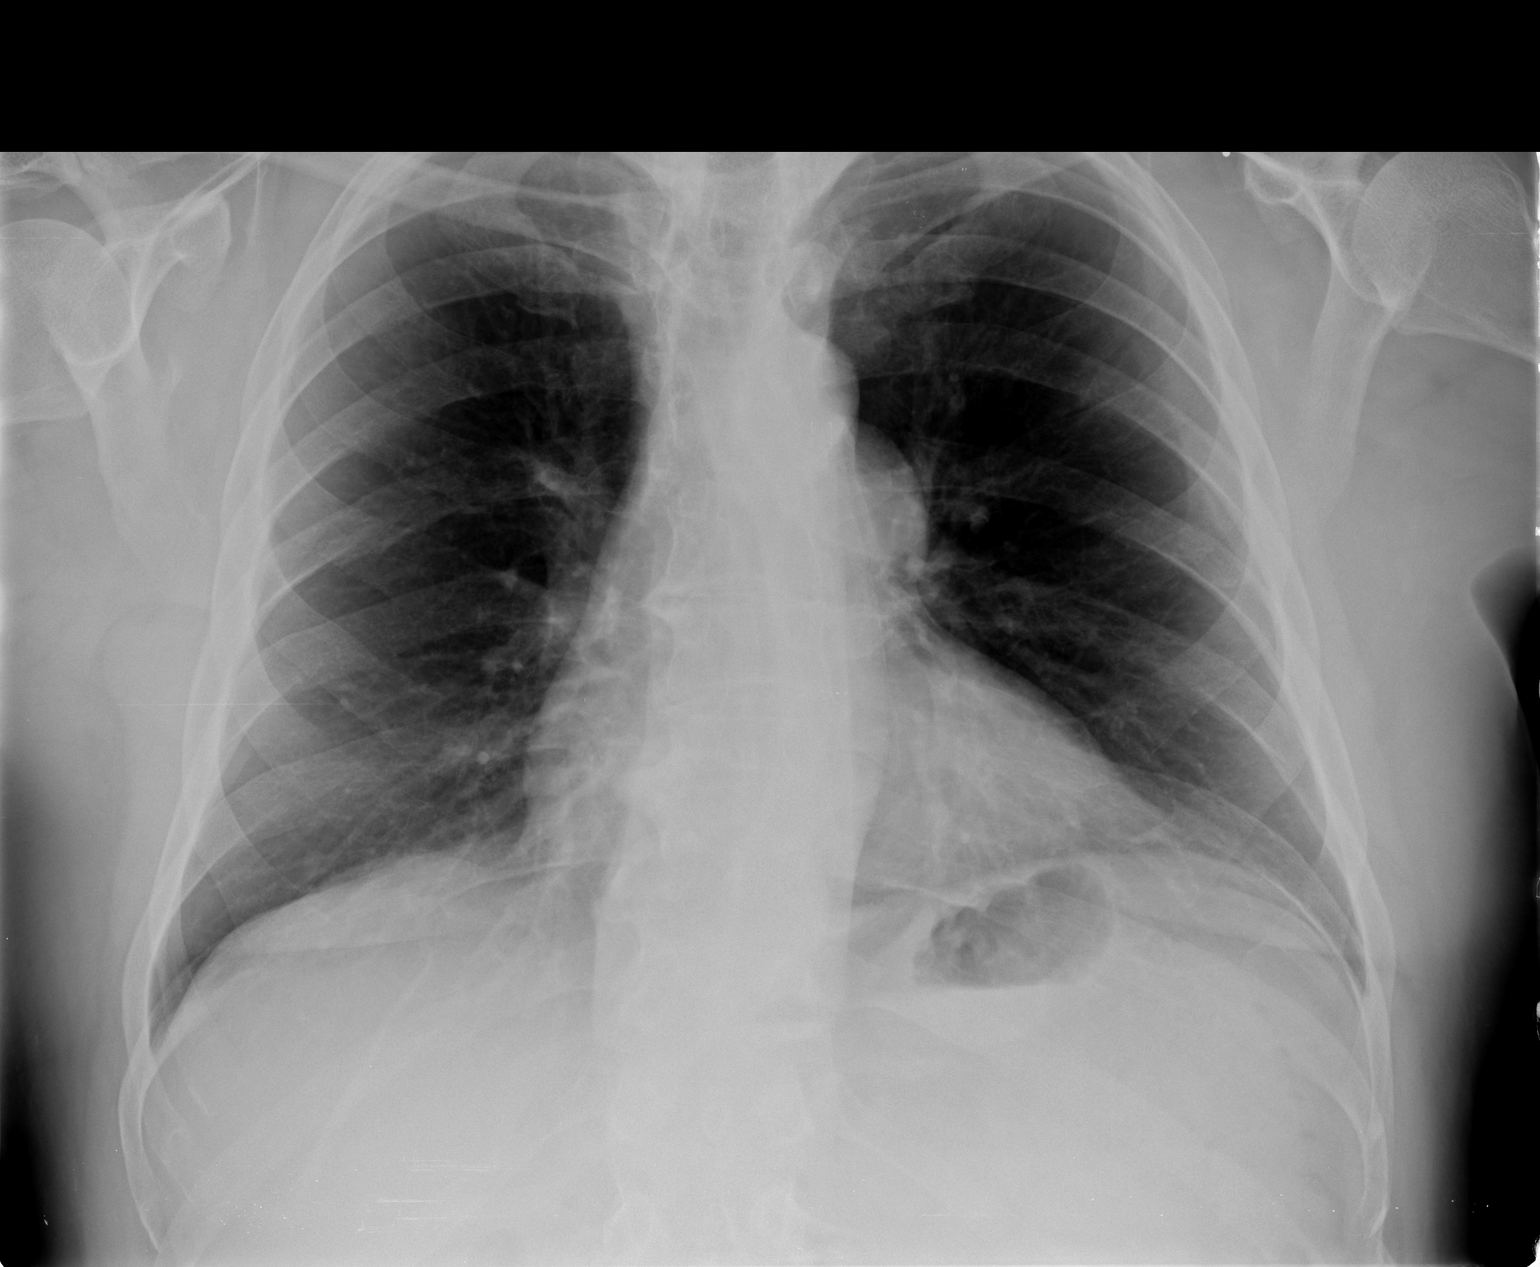

[view not recorded (2 of 2)]
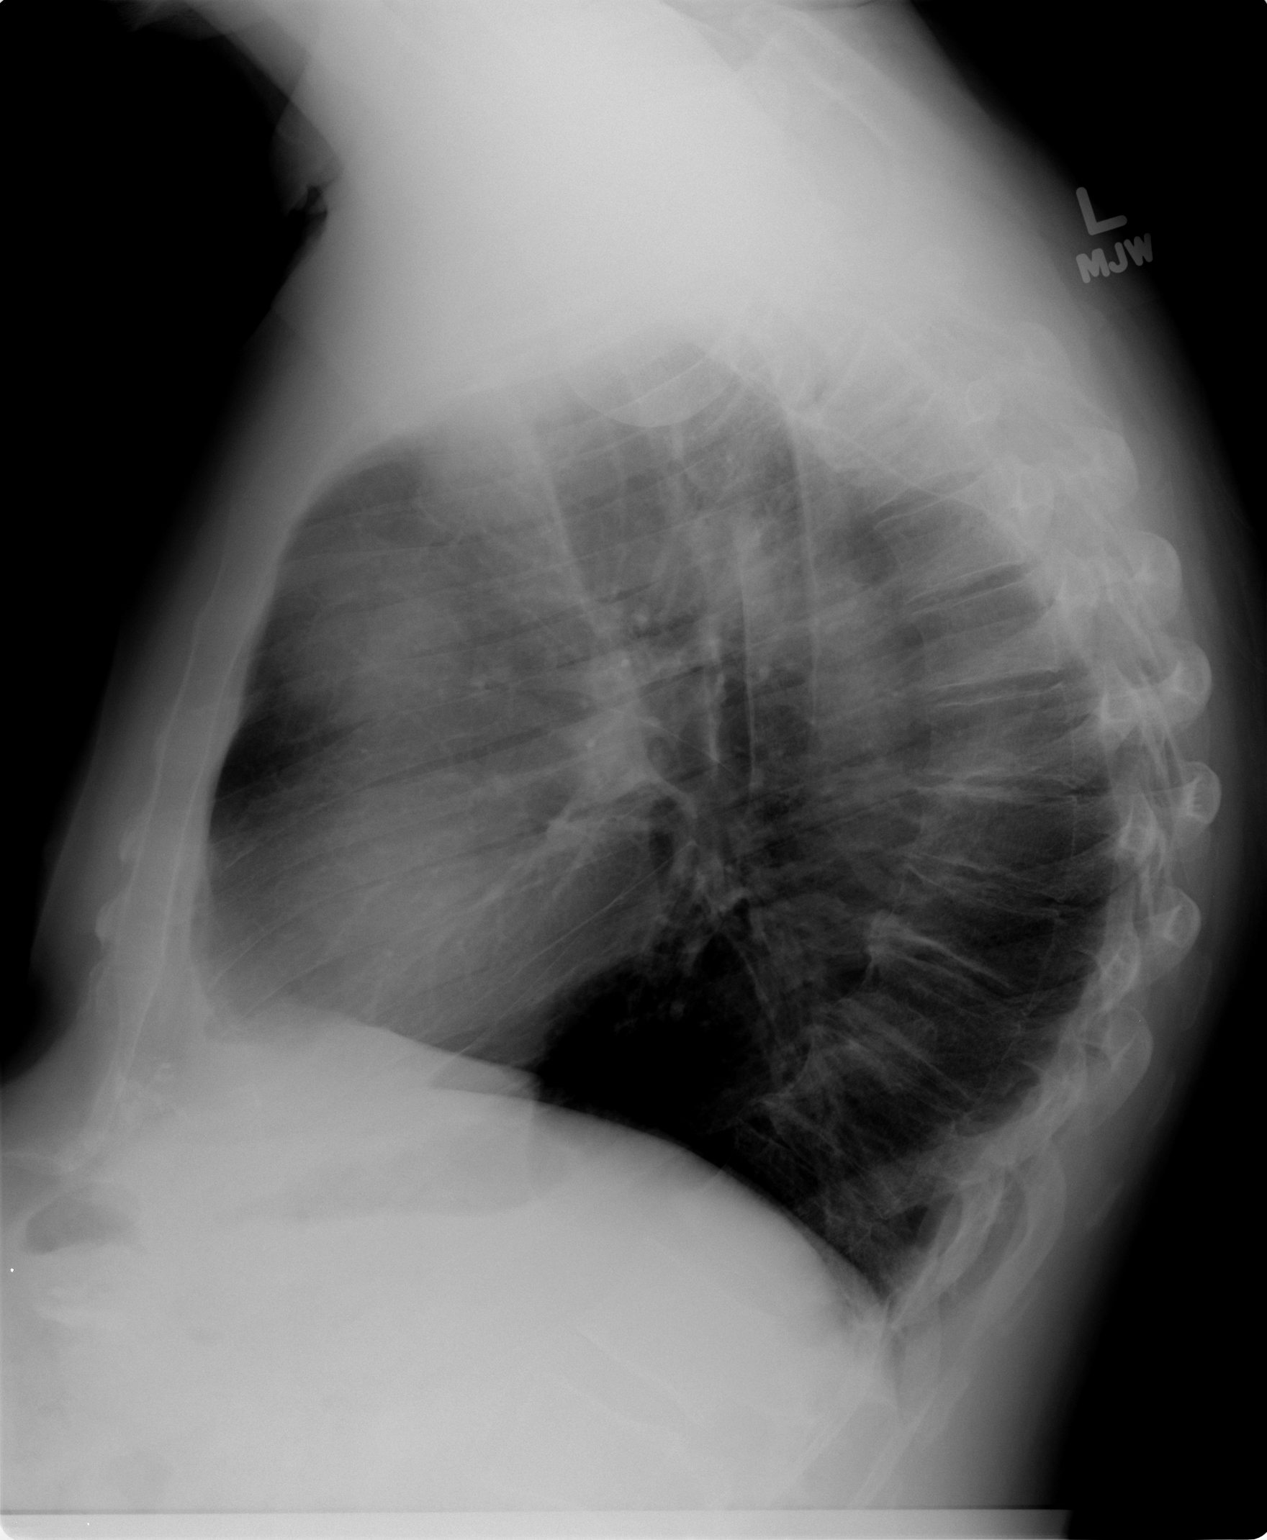

[2 of 2 positions shown; findings below may reference images not displayed]

FINDINGS: Stable anterior mediastinal mass.  The cardiac size is
normal.  The lungs are clear.  No pleural effusion.  The bony
thorax is intact.
IMPRESSION: Stable anterior mediastinal mass on the left.

## 2013-09-04 MED ORDER — GADOBENATE DIMEGLUMINE 529 MG/ML IV SOLN
17.0000 mL | Freq: Once | INTRAVENOUS | Status: AC | PRN
  Administered 2013-09-04: 17 mL via INTRAVENOUS

## 2013-09-05 ENCOUNTER — Telehealth: Payer: Self-pay | Admitting: Internal Medicine

## 2013-09-05 DIAGNOSIS — R932 Abnormal findings on diagnostic imaging of liver and biliary tract: Secondary | ICD-10-CM

## 2013-09-05 IMAGING — CR DG CHEST 1V PORT
1 series · 1 of 1 positions shown · non-contrast
Comparison: 09/02/2012

CLINICAL DATA: Status post VATS with resection of anterior
mediastinal mass.

PORTABLE CHEST - 1 VIEW

[AP]
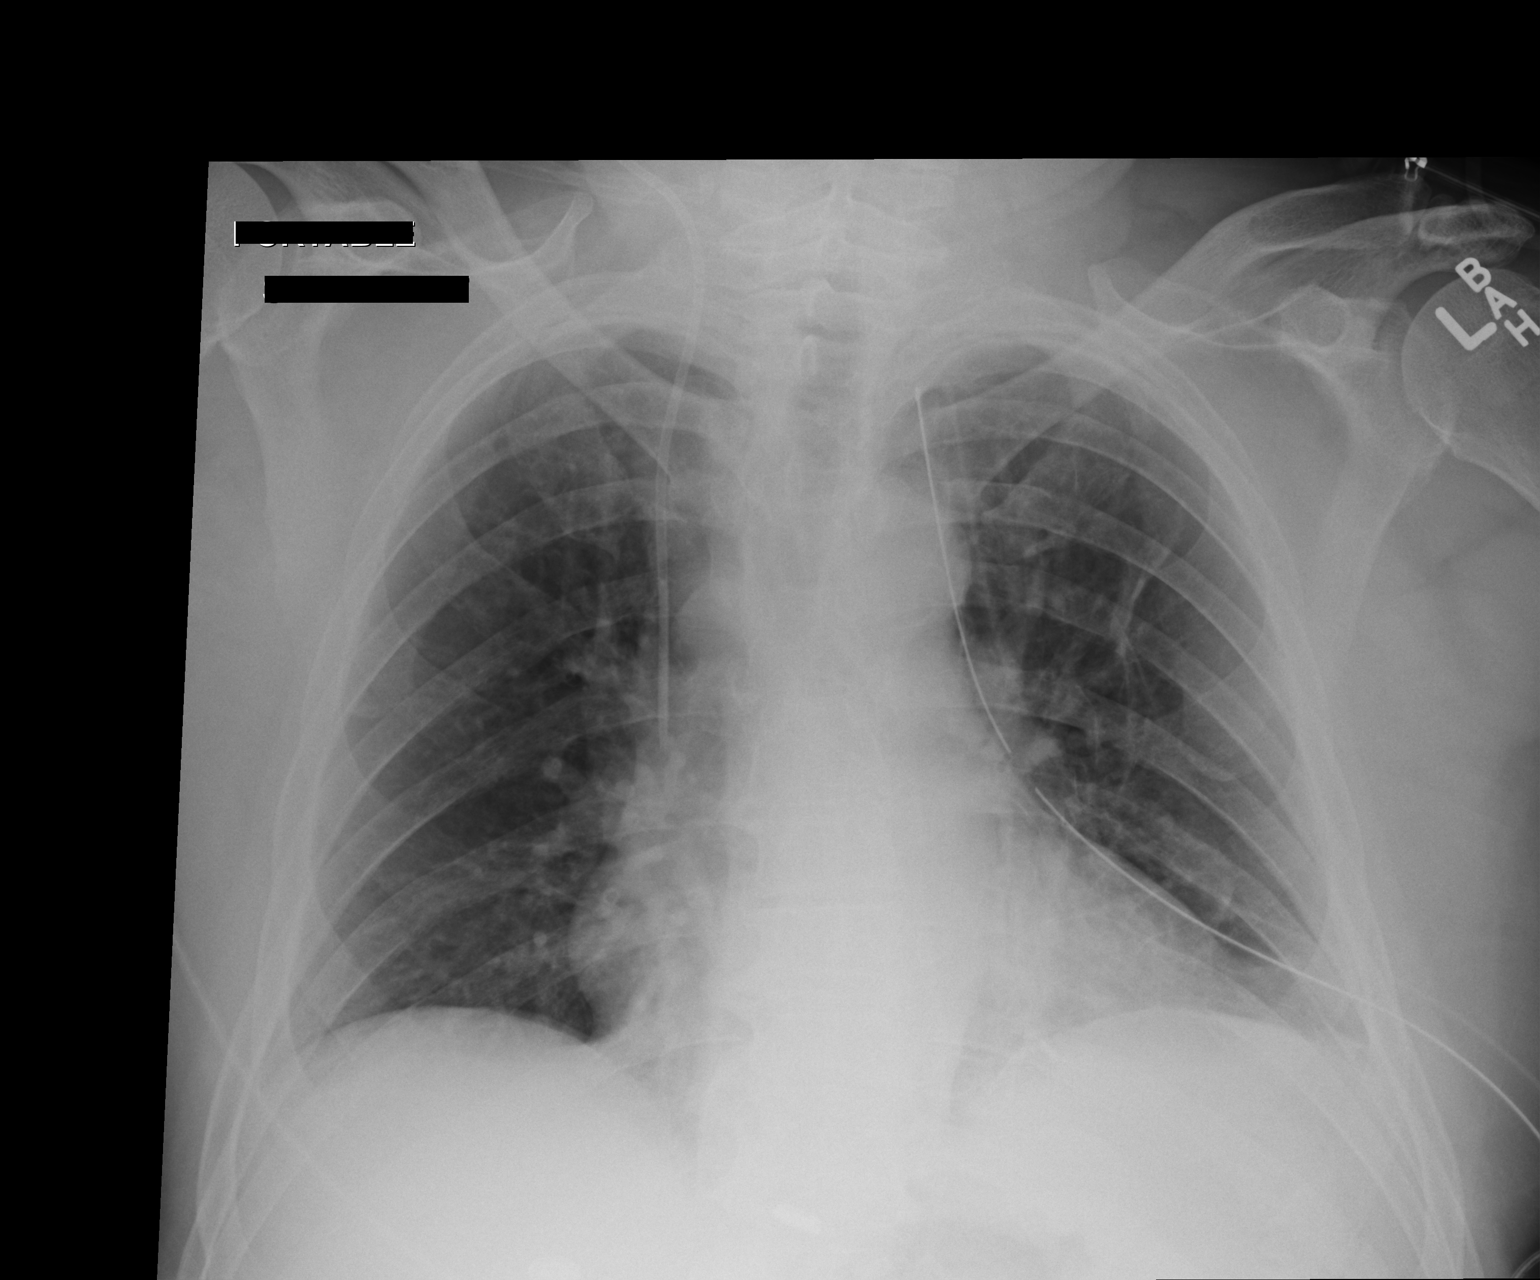

[1 of 1 positions shown; findings below may reference images not displayed]

FINDINGS: Postoperatively, a left-sided chest tube is in place
without pneumothorax.  Right jugular central line tip extends into
the lower SVC.  The lungs show minimal left basilar atelectasis.
No edema, focal consolidation or pleural fluid is identified.
Cardiac and mediastinal contours are unremarkable.
IMPRESSION: No pneumothorax postoperatively.  Minimal left basilar atelectasis
present.

## 2013-09-05 NOTE — Telephone Encounter (Signed)
Dan, Please review this for possible EUS. Thanks. Jenny Reichmann

## 2013-09-05 NOTE — Telephone Encounter (Signed)
Hi Michael [name]  MRI shows lesion in pancreas. Is it possible for you or your office to set up followup with Oretha [name] on this. I have copied you and [name] on this  Thanks  Please let me know.   Dr. Brand Males, M.D., [facility].C.P Pulmonary and Critical Care Medicine Staff Physician [place] [facility] Pager: [phone], If no answer or between  15:00h - 7:00h: call [phone]  09/05/2013 3:27 PM    [name] Contrast  09/05/2013   CLINICAL DATA:  Evaluate pancreatic mass identified on CT. Prior history of anterior mediastinal mass is  EXAM: MRI ABDOMEN WITHOUT AND WITH CONTRAST  TECHNIQUE: Multiplanar multisequence MR imaging of the abdomen was performed both before and after the administration of intravenous contrast.  CONTRAST:  17mL MULTIHANCE GADOBENATE DIMEGLUMINE 529 MG/ML IV SOLN  COMPARISON:  NM PET IMAGE INITIAL (PI) SKULL BASE TO THIGH dated 08/29/2012; CT CHEST W/O CM dated 08/20/2013  FINDINGS: Pancreas: There is multilobular cystic change involving the body and tail of the pancreas. This appears to be extensive side branch ductal ectasia. Several of these dilated cystic lesions appear to communicate with the pancreatic duct best seen on image 21, series 8 and image 22 series 8. Lesion in the tail of the pancreas measures 22 x 20 mm (image 22, series 8). Lesion along the body measures 14 x 14 mm. There are multiple smaller ectatic cystic lesions. The pancreatic duct itself is prominent but within normal limits at 3 mm. There is a smaller a 9 mm lesion in the uncinate process. A post-contrast enhanced T1 weighted images demonstrate no clear enhancement of these lesions.  Mild atelectasis at the right lung base. No focal hepatic lesion. No biliary duct dilatation. The gallbladder is normal. Common bile duct is normal.  The spleen, adrenal glands, and kidneys normal. Stomach and limited view bowel unremarkable. No aggressive osseous lesion.  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 Differential would also include chronic cystic change of pancreatitis. One small lesion is the uncinate process. Lesion may be accessible for EUS sampling. Recommend GI / surgical consultation.   Electronically Signed   By: Suzy Bouchard M.D.   On: 09/05/2013 08:19

## 2013-09-06 NOTE — Telephone Encounter (Signed)
Thanks a Academic librarian. I spoke to him yesterday and he is expecting a call from you    Dr. Brand Males, M.D., [facility].C.P Pulmonary and Critical Care Medicine Staff Physician [place] [facility] Pager: [phone], If no answer or between  15:00h - 7:00h: call [phone]  09/06/2013 8:19 AM

## 2013-09-06 NOTE — Telephone Encounter (Signed)
I reviewed MRI report, images.  Also CT portion of PET/CT from 2014 to me shows abnormal pancreas in same region.  Doubt he has current malignant but the process may be precancerous.  Patty, Can you contact him. Let him know that Dr. Chase Caller forwarded the xray results here and that I recommend we proceed with EUS (59min, +MAC, radial +/- linear, next available EUS thurs).  I am happy to discuss this in office with him first if he prefers.  John and Belva Crome, I'll take care of this

## 2013-09-06 NOTE — Telephone Encounter (Signed)
Thanks Dan

## 2013-09-07 ENCOUNTER — Telehealth: Payer: Self-pay | Admitting: Pulmonary Disease

## 2013-09-07 NOTE — Telephone Encounter (Signed)
Tussionex called in for persistent cough

## 2013-09-08 ENCOUNTER — Telehealth: Payer: Self-pay | Admitting: Internal Medicine

## 2013-09-08 NOTE — Telephone Encounter (Signed)
Michael [name],  Can you contact him. Let him know that Dr. Chase Caller forwarded the xray results here and that I recommend we proceed with EUS (57min, +MAC, radial +/- linear, next available EUS thurs). I am happy to discuss this in office with him first if he prefers.

## 2013-09-08 NOTE — Telephone Encounter (Signed)
We received a call from Marjory Lies, pharmacists with [facility]. He states last night Dr. Earnest Conroy called in a prescription for Tussionex, which by law he cannot do. Marjory Lies states he needs a hard script signed by Dr. Earnest Conroy dated for 09-07-13. Dr. Earnest [facility] is at [facility] so I called his cell and advised that we have to have him come by the office and sign an rx and explained the issues. He states he cannot say that he will be able to come by the office today but he will try. I advised we needed it signed by tomorrow in order to get it to the pharmacy in time. He states he will try. I have advised the pharmacy of this. I have written out the RX and placed it in triage in an envelope. I have also notified up front where rx is in case Dr. Earnest Conroy comes in to sign. Once the rx is signed please give it back to [place]. Thanks!

## 2013-09-09 ENCOUNTER — Other Ambulatory Visit: Payer: Self-pay

## 2013-09-09 ENCOUNTER — Encounter (HOSPITAL_COMMUNITY): Payer: Self-pay | Admitting: Pharmacy Technician

## 2013-09-09 DIAGNOSIS — R932 Abnormal findings on diagnostic imaging of liver and biliary tract: Secondary | ICD-10-CM

## 2013-09-09 NOTE — Telephone Encounter (Signed)
I spoke with Dr. Earnest Conroy and he said he would come by in an hour to sign RX. Michael [name] is aware at the pharmacy. I have rx in triage. [place] Bing, CMA

## 2013-09-09 NOTE — Telephone Encounter (Signed)
Marjory Lies, pharmacist w/ [facility] is calling back.  States he can be reached at [phone].  If he is not still there, please speak w/ Lillia.  Satira Anis

## 2013-09-09 NOTE — Telephone Encounter (Signed)
Dr Ardis Hughs the pt would like a call to discuss the procedure this is the best number to call him at [phone] I did instruct him and he will keep th appt as scheduled, he just has a few questions

## 2013-09-10 ENCOUNTER — Encounter (HOSPITAL_COMMUNITY): Payer: Self-pay | Admitting: *Deleted

## 2013-09-10 ENCOUNTER — Encounter: Payer: Self-pay | Admitting: Internal Medicine

## 2013-09-10 ENCOUNTER — Encounter: Payer: Self-pay | Admitting: Gastroenterology

## 2013-09-11 ENCOUNTER — Encounter: Payer: Self-pay | Admitting: Gastroenterology

## 2013-09-11 NOTE — Telephone Encounter (Signed)
The patient sent an email to [place] with several questions about his upcoming procedure. I have copied them below for you to address. Thanks!  Can someone please update me on the following: - what is the consensus on my pancreatic condition; is it malignant? - why are we having the endoscopic ultrasound next week; what is to be learned? - is this upcoming procedure the same one that is being blamed for the spread of super bugs in hospitals? - once this procedure is done, what are the decision points going forward?  Details, please!!

## 2013-09-11 NOTE — Telephone Encounter (Signed)
rx signed and delivered to the pharmacy. [place] Bing, CMA

## 2013-09-11 NOTE — Telephone Encounter (Signed)
I spoke with him this am on the phone and answered all his questions about next week's EUS

## 2013-09-18 ENCOUNTER — Encounter (HOSPITAL_COMMUNITY): Payer: Medicare Other | Admitting: Anesthesiology

## 2013-09-18 ENCOUNTER — Encounter (HOSPITAL_COMMUNITY)
Admission: RE | Disposition: A | Discharge status: Home / Self Care | Payer: Self-pay | Source: Ambulatory Visit | Attending: Gastroenterology

## 2013-09-18 ENCOUNTER — Ambulatory Visit (HOSPITAL_COMMUNITY)
Admission: RE | Admit: 2013-09-18 | Discharge: 2013-09-18 | Disposition: A | Discharge status: Home / Self Care | Payer: Medicare Other | Source: Ambulatory Visit | Attending: Gastroenterology | Admitting: Gastroenterology

## 2013-09-18 ENCOUNTER — Encounter (HOSPITAL_COMMUNITY): Payer: Self-pay

## 2013-09-18 ENCOUNTER — Ambulatory Visit (HOSPITAL_COMMUNITY): Payer: Medicare Other | Admitting: Anesthesiology

## 2013-09-18 DIAGNOSIS — K2970 Gastritis, unspecified, without bleeding: Secondary | ICD-10-CM | POA: Diagnosis not present

## 2013-09-18 DIAGNOSIS — Z9889 Other specified postprocedural states: Secondary | ICD-10-CM | POA: Diagnosis not present

## 2013-09-18 DIAGNOSIS — R911 Solitary pulmonary nodule: Secondary | ICD-10-CM | POA: Insufficient documentation

## 2013-09-18 DIAGNOSIS — K863 Pseudocyst of pancreas: Principal | ICD-10-CM

## 2013-09-18 DIAGNOSIS — K862 Cyst of pancreas: Secondary | ICD-10-CM | POA: Diagnosis not present

## 2013-09-18 DIAGNOSIS — R932 Abnormal findings on diagnostic imaging of liver and biliary tract: Secondary | ICD-10-CM | POA: Diagnosis not present

## 2013-09-18 DIAGNOSIS — K219 Gastro-esophageal reflux disease without esophagitis: Secondary | ICD-10-CM | POA: Diagnosis not present

## 2013-09-18 DIAGNOSIS — K117 Disturbances of salivary secretion: Secondary | ICD-10-CM | POA: Insufficient documentation

## 2013-09-18 DIAGNOSIS — J438 Other emphysema: Secondary | ICD-10-CM | POA: Diagnosis not present

## 2013-09-18 DIAGNOSIS — D759 Disease of blood and blood-forming organs, unspecified: Secondary | ICD-10-CM | POA: Insufficient documentation

## 2013-09-18 DIAGNOSIS — K2990 Gastroduodenitis, unspecified, without bleeding: Secondary | ICD-10-CM

## 2013-09-18 DIAGNOSIS — R933 Abnormal findings on diagnostic imaging of other parts of digestive tract: Secondary | ICD-10-CM

## 2013-09-18 HISTORY — PX: EUS: SHX5427

## 2013-09-18 LAB — PANC CYST FLD ANLYS-PATHFNDR-TG

## 2013-09-18 SURGERY — UPPER ENDOSCOPIC ULTRASOUND (EUS) LINEAR
Anesthesia: Monitor Anesthesia Care

## 2013-09-18 MED ORDER — PROPOFOL 10 MG/ML IV BOLUS
INTRAVENOUS | Status: DC | PRN
  Administered 2013-09-18 (×2): 50 mg via INTRAVENOUS

## 2013-09-18 MED ORDER — SODIUM CHLORIDE 0.9 % IV SOLN: INTRAVENOUS | Status: DC

## 2013-09-18 MED ORDER — CIPROFLOXACIN HCL 500 MG PO TABS: 500.0000 mg | ORAL_TABLET | Freq: Two times a day (BID) | ORAL | Status: DC

## 2013-09-18 MED ORDER — PROPOFOL INFUSION 10 MG/ML OPTIME
INTRAVENOUS | Status: DC | PRN
  Administered 2013-09-18: 140 ug/kg/min via INTRAVENOUS

## 2013-09-18 MED ORDER — LACTATED RINGERS IV SOLN: INTRAVENOUS | Status: DC

## 2013-09-18 MED ORDER — LACTATED RINGERS IV SOLN
INTRAVENOUS | Status: DC | PRN
  Administered 2013-09-18: 07:00:00 via INTRAVENOUS

## 2013-09-18 MED ORDER — CIPROFLOXACIN IN D5W 400 MG/200ML IV SOLN
INTRAVENOUS | Status: AC
  Filled 2013-09-18: qty 200

## 2013-09-18 MED ORDER — CIPROFLOXACIN IN D5W 400 MG/200ML IV SOLN
400.0000 mg | Freq: Two times a day (BID) | INTRAVENOUS | Status: DC
Start: 2013-09-18 — End: 2013-09-18
  Administered 2013-09-18: 400 mg via INTRAVENOUS

## 2013-09-18 MED ORDER — PROPOFOL 10 MG/ML IV BOLUS
INTRAVENOUS | Status: AC
Start: 2013-09-18 — End: 2013-09-18
  Filled 2013-09-18: qty 20

## 2013-09-18 MED ORDER — LIDOCAINE HCL (PF) 2 % IJ SOLN
INTRAMUSCULAR | Status: DC | PRN
  Administered 2013-09-18: 20 mg via INTRADERMAL

## 2013-09-18 MED ORDER — PROPOFOL 10 MG/ML IV BOLUS
INTRAVENOUS | Status: AC
  Filled 2013-09-18: qty 20

## 2013-09-18 MED ORDER — LIDOCAINE HCL (CARDIAC) 20 MG/ML IV SOLN
INTRAVENOUS | Status: AC
  Filled 2013-09-18: qty 5

## 2013-09-18 NOTE — Anesthesia Postprocedure Evaluation (Signed)
  Anesthesia Post-op Note  Patient: Michael [name]  Procedure(s) Performed: Procedure(s) (LRB): UPPER ENDOSCOPIC ULTRASOUND (EUS) LINEAR (N/A)  Patient Location: PACU  Anesthesia Type: MAC  Level of Consciousness: awake and alert   Airway and Oxygen Therapy: Patient Spontanous Breathing  Post-op Pain: mild  Post-op Assessment: Post-op Vital signs reviewed, Patient's Cardiovascular Status Stable, Respiratory Function Stable, Patent Airway and No signs of Nausea or vomiting  Last Vitals:  Filed Vitals:   09/18/13 0938  BP: 103/63  Pulse: 62  Temp: 36.6 C  Resp: 18    Post-op Vital Signs: stable   Complications: No apparent anesthesia complications

## 2013-09-18 NOTE — Preoperative (Signed)
Beta Blockers   Reason not to administer Beta Blockers:Not Applicable 

## 2013-09-18 NOTE — Interval H&P Note (Signed)
History and Physical Interval Note:  09/18/2013 8:30 AM  Michael [name]  has presented today for surgery, with the diagnosis of Abnormal CT scan, pancreas or bile duct [793.3]  The various methods of treatment have been discussed with the patient and family. After consideration of risks, benefits and other options for treatment, the patient has consented to  Procedure(s) with comments: UPPER ENDOSCOPIC ULTRASOUND (EUS) LINEAR (N/A) - radial linear as a surgical intervention .  The patient's history has been reviewed, patient examined, no change in status, stable for surgery.  I have reviewed the patient's chart and labs.  Questions were answered to the patient's satisfaction.     [name]

## 2013-09-18 NOTE — Op Note (Addendum)
[facility] [place], [address]   ENDOSCOPIC ULTRASOUND PROCEDURE REPORT PATIENT: Michael [name], Michael [name]  MR#: [id number] BIRTHDATE: 26-Jun-1941  GENDER: Male ENDOSCOPIST: [name], MD REFERRED BY:  Brand Males, M.D. PROCEDURE DATE:  09/18/2013 PROCEDURE:   Upper EUS w/FNA ASA CLASS:      Class III INDICATIONS:   incidentally noted, abnormal pancreas (during followup imaging for thymoma), moderate chronic Etoh use, no previous pancreatic disease, no pancreatic disease in family, no FH of pancreatic disease. MEDICATIONS: MAC sedation, administered by CRNA and Cipro 400 mg IV   DESCRIPTION OF PROCEDURE:   After the risks benefits and alternatives of the procedure were  explained, informed consent was obtained. The patient was then placed in the left, lateral, decubitus postion and IV sedation was administered. Throughout the procedure, the patients blood pressure, pulse and oxygen saturations were monitored continuously.  Under direct visualization, the EUS scope  endoscope was introduced through the mouth  and advanced to the second portion of the duodenum .  Water was used as necessary to provide an acoustic interface.  Upon completion of the imaging, water was removed and the patient was sent to the recovery room in satisfactory condition.  Endoscopic findings (limited views with echoendoscopes, ERCP scope); 1. Mild gastritis 2. Normal major papilla EUS findings: 1. CBD was normal, non-dilated 2. There were several cystic lesions in pancreas.  One was 1cm across, located in uncinate. Two were in body/tail region (ranged in size from 1.4cm to 1.8cm).  None of these were associated with solid masses or mural nodules.  The cysts contained thin septea. The cystic lesion in tail (1.8cm across) was sampled with single transgastric pass of a 22 guage EUS FNA needle.  This yielded 3cc of milky, thin fluid that was sent for analysis. 3.  Pancreatic parenchyma was otherwise normal appearing; there were no solid masses, no signs of chronic pancreatitis 4. The main pancreatic duct does appear to communicate with the cysts mentioned above but the duct was otherwise normald appearing (non-dilated). 5. No peripancreatic adenopathy Impression: Multiple cysts throughout pancreas, all appear to communicate with the main pancreatic duct.  None of the cysts are associated with solid masses.  The cyst in tail of pancreas was sampled and the fluid was sent for CEA, amylase, cytology.  Await final fluid test results.  He will complete 3 days of twice daily cipro.  _______________________________ eSignedMilus Banister, MD 09/18/2013 9:49 AM Revised: 09/18/2013 9:49 AM  cc: Scarlette Shorts, MD

## 2013-09-18 NOTE — Transfer of Care (Signed)
Immediate Anesthesia Transfer of Care Note  Patient: Michael [name]  Procedure(s) Performed: Procedure(s) (LRB): UPPER ENDOSCOPIC ULTRASOUND (EUS) LINEAR (N/A)  Patient Location: PACU  Anesthesia Type: MAC  Level of Consciousness: sedated, patient cooperative and responds to stimulation  Airway & Oxygen Therapy: Patient Spontanous Breathing and Patient connected to face mask oxgen  Post-op Assessment: Report given to PACU RN and Post -op Vital signs reviewed and stable  Post vital signs: Reviewed and stable  Complications: No apparent anesthesia complications

## 2013-09-18 NOTE — Discharge Instructions (Signed)

## 2013-09-18 NOTE — H&P (View-Only) (Signed)
Subjective:    Patient ID: Michael [name], male    DOB: [birth year], 73 y.o.   MRN: [id number]  HPI  #Thymoma stage I  - Diagnosed during low-dose CT scan of the chest screening program for lung cancer - Status post complete resection without residual disease September 03, 2012 - Followup Dr Servando Snare  #COPD mild, Gold stage I versus mild asthma - COPD cat score 4 on 10/08/2012  - Pulmonary function test done February 2014: Prebronchodilator FEV1 while on Spiriva 2.4 L/76%. Postbronchodilator FEV1 2.6 L/82% with FEV1 FEC ratio of 69 prebronchodilator and 76 post bronchodilator. Diffusion capacity 90%   #Pulmonary nodule - 3 mm lung nodule right upper lobe and left upper lobe diagnosed February 2014 during CT scan chest screening program for lung cancer   OV 10/08/2012  Followup for all of the above. He is now status post resection of thymoma which is now stage I. Looks like she will have annual surveillance CT scan of the chest with Dr. Servando Snare He is doing well. In retrospect he recollects retrosternal pain that is now completely resolved after surgery. He also does have some fatigue which is better. He denies any myasthenia symptoms. He continues with Spiriva and he finds good relief. I reviewed his pulmonary function test and is more suggestive of asthma because of normal diffusion capacity but he is content taking Spiriva and does not want to switch.  Currently he is putting his life back together and wants to focus on fitness by joining the [facility] [employer]  There are no new issues   OV 07/31/2013  Chief Complaint  Patient presents with  . Follow-up    Pt states he feels SOB is improved since last OV. Pt c/o having some PND, and dry mouth.     Followup for  - Emphysema mild: Currently stable. He is using Spiriva which he likes except for the fact that it might be associated with dry mouth [see below]. COPD cat score is 10 and reflects mild symptom burden.   - Lung nodule: He is due for his  serial CT scan of the chest in mid February 2015. This will mark 1 year followup  - Thymoma: Stage I. Status post resection. He is due for his one-year CT scan of the chest but February 2015 and this needs to be arranged  -- New complaint: Started Spiriva 8-9 months ago. However for the last 4 months even in the fall in the summer started noticing severe dry mouth especially at night. This is not associated with dry eyes. It is there daily. It is not progressive. It is relieved by drinking water. It is associated with Spiriva intake. His nose is her autoimmune symptoms or any other associated symptoms. This no radiation s    REC #Dry mouth   - stop spiriva for 3 weeks   #Mild emphysema  - currently stable  - due to above issue, stop spiriva.   - use albuterol 2 puff as needed; take script 0- if you get worse, call us  #Lung nodule and thymoma followup  - do CT scan chest with contrast mid feb 2015  #Followup  3weeks to assess followup of above issues  OV 08/22/2013  Chief Complaint  Patient presents with  . Follow-up    to review CT results. Pt states cough and sob has improved with spiriva.     Fu several issues  - dry mouth: did not improve after dc spiriva. So went back on spiriva. Advised biotene  mouth wash and he is better. He is due to see dentist.   - emphysema: got worse after he stopped spiriva but improved and back to baseline after he went back on spiriva  - lung nodule and thymoma; had 1 year CT chest that shows no recurence and nodule stability  - New issue: CT chest feb 2015 shows possible mass lesion tail of pancreas and more advnaced imaging recommended. He prefers I d/w Dr Henrene Pastor his GI first before ordering advanced imaging   Ct Chest Wo Contrast  08/20/2013   CLINICAL DATA:  Routine followup lung nodule  EXAM: CT CHEST WITHOUT CONTRAST  TECHNIQUE: Multidetector CT imaging of the chest was performed following the standard protocol without IV contrast.   COMPARISON:  08/19/2012  FINDINGS: No pleural effusion identified. There is no airspace consolidation. Scarring noted within the posterior lung bases. Scar is identified within the left upper lobe, image 19/series 3 stable tiny left upper lobe nodule measuring 3 mm, image 18/series 3. Right upper lobe nodule measures 3 mm and is unchanged from previous exam, image 22/series 3. No new or enlarging pulmonary nodules or masses. 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 No evidence for recurrence of tumor. Calcified atherosclerotic change involves the thoracic aorta. There is no aneurysm. There is also a calcification within the LAD coronary artery. No mediastinal or hilar adenopathy noted. There is no axillary or supraclavicular adenopathy.  Incidental imaging through the upper abdomen shows no acute findings. However, there is a change in the appearance of the distal tail of pancreas. An area of soft tissue fullness and mild peripheral and distinctness measures 2.5 cm, image 57/series 2.  IMPRESSION: 1. Stable small pulmonary nodules which are likely benign. 2. Status post resection of anterior mediastinal mass without evidence for recurrence of tumor. 3. Interval change in the appearance of the distal tail of pancreas which now exhibits abnormal soft tissue fullness and mild peripheral indistinctness. Underlying pancreatic mass cannot be excluded on this noncontrast CT through the upper abdomen. Recommend further evaluation with contrast enhanced CT or MRI of the pancreas. These results will be called to the ordering clinician or representative by the Radiologist Assistant, and communication documented in the PACS Dashboard. .   Electronically Signed   By: Kerby Moors M.D.   On: 08/20/2013 15:59     Review of Systems  Constitutional: Negative for fever and unexpected weight change.  HENT: Positive for postnasal drip. Negative for congestion, dental problem, ear pain, nosebleeds,  rhinorrhea, sinus pressure, sneezing, sore throat and trouble swallowing.   Eyes: Negative for redness and itching.  Respiratory: Positive for cough and shortness of breath. Negative for chest tightness and wheezing.   Cardiovascular: Negative for palpitations and leg swelling.  Gastrointestinal: Negative for nausea and vomiting.  Genitourinary: Negative for dysuria.  Musculoskeletal: Negative for joint swelling.  Skin: Negative for rash.  Neurological: Negative for headaches.  Hematological: Does not bruise/bleed easily.  Psychiatric/Behavioral: Negative for dysphoric mood. The patient is not nervous/anxious.        Objective:   Physical Exam  Filed Vitals:   08/22/13 1101  BP: 112/80  Pulse: 80  Height: 5\' 10"  (1.778 m)  Weight: 186 lb (84.369 kg)  SpO2: 97%   Discussion only visit      Assessment & Plan:  #Dry mouth   - glad some better with biotene; so continue that - agree that you should see your dentist   #Mild emphysema  - currently stable  -  continue  spiriva.   - use albuterol 2 puff as needed; take script - if you get worse, call us  #Lung nodule and thymoma followup  - no recurrence  CT scan chest with contrast mid feb 2015 - do next CT chest wo contrast feb 2016  #Possible lesion in pancreas tail on CT chest feb 2015  - I will coordinate with Dr Henrene Pastor and get back to you  #Followup  - await our call in a few to several days  - otherwise, 9 months for copd  > 50% of this > 25 min visit spent in face to face counseling (15 min visit converted to 25 min)

## 2013-09-18 NOTE — Anesthesia Preprocedure Evaluation (Addendum)
Anesthesia Evaluation  Patient identified by MRN, date of birth, ID band Patient awake    Reviewed: Allergy & Precautions, H&P , NPO status , Patient's Chart, lab work & pertinent test results  Airway Mallampati: II TM Distance: >3 FB Neck ROM: Full   Comment: Jaw pops with extreme opening. Dental no notable dental hx.  Broken left upper molar.:   Pulmonary COPD COPD inhaler, former smoker,  breath sounds clear to auscultation  Pulmonary exam normal       Cardiovascular hypertension, Pt. on medications Rhythm:Regular Rate:Normal     Neuro/Psych negative neurological ROS  negative psych ROS   GI/Hepatic Neg liver ROS, GERD-  Medicated,  Endo/Other  negative endocrine ROS  Renal/GU negative Renal ROS  negative genitourinary   Musculoskeletal negative musculoskeletal ROS (+)   Abdominal   Peds negative pediatric ROS (+)  Hematology  (+) Blood dyscrasia, ,   Anesthesia Other Findings   Reproductive/Obstetrics negative OB ROS                         Anesthesia Physical Anesthesia Plan  ASA: III  Anesthesia Plan: MAC   Post-op Pain Management:    Induction: Intravenous  Airway Management Planned:   Additional Equipment:   Intra-op Plan:   Post-operative Plan:   Informed Consent: I have reviewed the patients History and Physical, chart, labs and discussed the procedure including the risks, benefits and alternatives for the proposed anesthesia with the patient or authorized representative who has indicated his/her understanding and acceptance.   Dental advisory given  Plan Discussed with: CRNA  Anesthesia Plan Comments:        Anesthesia Quick Evaluation

## 2013-09-19 ENCOUNTER — Encounter (HOSPITAL_COMMUNITY): Payer: Self-pay | Admitting: Gastroenterology

## 2013-09-26 ENCOUNTER — Telehealth: Payer: Self-pay

## 2013-09-26 ENCOUNTER — Other Ambulatory Visit: Payer: Self-pay

## 2013-09-26 DIAGNOSIS — R933 Abnormal findings on diagnostic imaging of other parts of digestive tract: Secondary | ICD-10-CM

## 2013-09-26 NOTE — Telephone Encounter (Signed)
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

## 2013-09-26 NOTE — Telephone Encounter (Signed)
[facility] to call pt  

## 2013-10-02 ENCOUNTER — Encounter: Payer: Self-pay | Admitting: Internal Medicine

## 2013-10-02 ENCOUNTER — Encounter: Payer: Self-pay | Admitting: Cardiothoracic Surgery

## 2013-10-02 ENCOUNTER — Ambulatory Visit (INDEPENDENT_AMBULATORY_CARE_PROVIDER_SITE_OTHER): Payer: Medicare Other | Admitting: Cardiothoracic Surgery

## 2013-10-02 VITALS — BP 134/87 | HR 80 | Resp 20 | Ht 70.0 in | Wt 182.0 lb

## 2013-10-02 DIAGNOSIS — D150 Benign neoplasm of thymus: Secondary | ICD-10-CM

## 2013-10-02 NOTE — Progress Notes (Signed)
[facility] Record #[id number] Date of Birth: August 23, 1940  Leanna Battles, MD Donnajean Lopes, MD  Chief Complaint:   PostOp Follow Up Visit 09/03/2012   OPERATIVE REPORT  PREOPERATIVE DIAGNOSIS: Anterior mediastinal mass.  POSTOPERATIVE DIAGNOSIS: Anterior mediastinal mass. Final path  pending.  SURGICAL PROCEDURE: Left video-assisted thoracoscopy and complete  resection of anterior mediastinal mass and bronchoscopy.   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 He's done well postoperatively returning to near-normal activities already. Now  More then  12  months postop. Recent CT scan of the chest to followup on lung nodules and his previous mediastinal resection raised the issue of abnormality in the pancreas. Further evaluation by the MRI and by GI, the patient has been referred to consider pancreatic resection.    History  Smoking status  . Former Smoker -- 1.00 packs/day for 25 years  . Types: Cigarettes  . Quit date: 07/03/1982  Smokeless tobacco  . Never Used       No Known Allergies  Current Outpatient Prescriptions  Medication Sig Dispense Refill  . ALPRAZolam (XANAX) 0.25 MG tablet Take 0.25 mg by mouth daily as needed. For anxiety      . amLODipine (NORVASC) 5 MG tablet Take 2.5-5 mg by mouth 2 (two) times daily. 5mg  in the am and 2.5 mg in the pm      . aspirin EC 81 MG tablet Take 81 mg by mouth every evening.      Marland Kitchen atorvastatin (LIPITOR) 40 MG tablet Take 40 mg by mouth every evening.      . Calcium Carbonate-Vitamin D (CALCIUM + D PO) Take 1 tablet by mouth daily.      . Coenzyme Q10 (CO Q 10) 100 MG CAPS Take 200 mg by mouth daily.      . famotidine (PEPCID) 20 MG tablet Take 20 mg by mouth daily  as needed for heartburn. For heartburn      . fluoruracil (CARAC) 0.5 % cream Apply topically daily.      Marland Kitchen ibuprofen (ADVIL,MOTRIN) 200 MG tablet Take 400 mg by mouth every 8 (eight) hours as needed for moderate pain.       Marland Kitchen lisinopril (PRINIVIL,ZESTRIL) 10 MG tablet Take 10 mg by mouth every morning.      . Multiple Vitamins-Minerals (CENTRUM SILVER PO) Take by mouth.      . tiotropium (SPIRIVA) 18 MCG inhalation capsule Place 1 capsule (18 mcg total) into inhaler and inhale daily.  90 capsule  0   No current facility-administered medications for this visit.       Physical Exam: BP 134/87  Pulse 80  Resp 20  Ht 5\' 10"  (1.778 m)  Wt 182 lb (82.555 kg)  BMI 26.11 kg/m2  SpO2 98%  General appearance: alert and cooperative Neurologic: intact Heart: regular rate and rhythm, S1, S2 normal, no murmur, click, rub or gallop and normal apical impulse Lungs: clear to auscultation bilaterally and normal percussion bilaterally Abdomen: soft, non-tender; bowel sounds normal; no masses,  no organomegaly Extremities: extremities normal, atraumatic, no cyanosis or edema and Homans sign is negative, no sign of DVT Wound: His  left chest incisions are all well-healed    Diagnostic Studies & Laboratory data:         Recent Radiology Findings: [name] Contrast  09/05/2013   CLINICAL DATA:  Evaluate pancreatic mass identified on CT. Prior history of anterior mediastinal mass is  EXAM: MRI ABDOMEN WITHOUT AND WITH CONTRAST  TECHNIQUE: Multiplanar multisequence MR imaging of the abdomen was performed both before and after the administration of intravenous contrast.  CONTRAST:  37mL MULTIHANCE GADOBENATE DIMEGLUMINE 529 MG/ML IV SOLN  COMPARISON:  NM PET IMAGE INITIAL (PI) SKULL BASE TO THIGH dated 08/29/2012; CT CHEST W/O CM dated 08/20/2013  FINDINGS: Pancreas: There is multilobular cystic change involving the body and tail of the pancreas. This appears to be extensive side branch ductal ectasia.  Several of these dilated cystic lesions appear to communicate with the pancreatic duct best seen on image 21, series 8 and image 22 series 8. Lesion in the tail of the pancreas measures 22 x 20 mm (image 22, series 8). Lesion along the body measures 14 x 14 mm. There are multiple smaller ectatic cystic lesions. The pancreatic duct itself is prominent but within normal limits at 3 mm. There is a smaller a 9 mm lesion in the uncinate process. A post-contrast enhanced T1 weighted images demonstrate no clear enhancement of these lesions.  Mild atelectasis at the right lung base. No focal hepatic lesion. No biliary duct dilatation. The gallbladder is normal. Common bile duct is normal.  The spleen, adrenal glands, and kidneys normal. Stomach and limited view bowel unremarkable. No aggressive osseous lesion.  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 Differential would also include chronic cystic change of pancreatitis. One small lesion is the uncinate process. Lesion may be accessible for EUS sampling. Recommend GI / surgical consultation.   Electronically Signed   By: Suzy Bouchard M.D.   On: 09/05/2013 08:19     Recent Labs: Lab Results  Component Value Date   WBC 10.3 09/05/2012   HGB 13.2 09/05/2012   HCT 37.9* 09/05/2012   PLT 191 09/05/2012   GLUCOSE 108* 09/05/2012   CHOL 138 02/06/2013   TRIG 67.0 02/06/2013   HDL 53.60 02/06/2013   LDLCALC 71 02/06/2013   ALT 21 02/06/2013   AST 19 02/06/2013   NA 138 09/05/2012   K 4.2 09/05/2012   CL 105 09/05/2012   CREATININE 1.21 09/05/2012   BUN 13 09/05/2012   CO2 27 09/05/2012   INR 0.95 09/02/2012   PATH: Mediastinum, mass resection, Anterior - THYMOMA, 6.4 CM - SEE COMMENT. Microscopic Comment The specimen reveals an encapsulated well circumscribed mass. Histologic evaluation reveals a mixed population of mature  appearing lymphocytes and macrophages. Immunohistochemical stains for CD3, CD79a, CD43, CD20 and CD5, reveal that the vast majority of the cells are T cells.   Assessment / Plan:      Patient doing well following the resection of a 6.4 cm anterior mediastinal mass done on final path was determined to be a benign thymoma, no evidence of recurrence or change in lung nodules Plan to see the patient back in 12 months  He notes that he has appointments to see Dr. Barry Dienes to review the pancreatic findings.  Miliani Deike B 10/02/2013 10:20 AM

## 2013-10-06 ENCOUNTER — Ambulatory Visit (INDEPENDENT_AMBULATORY_CARE_PROVIDER_SITE_OTHER): Payer: Medicare Other | Admitting: General Surgery

## 2013-10-06 ENCOUNTER — Encounter (INDEPENDENT_AMBULATORY_CARE_PROVIDER_SITE_OTHER): Payer: Self-pay | Admitting: General Surgery

## 2013-10-06 ENCOUNTER — Telehealth: Payer: Self-pay | Admitting: Gastroenterology

## 2013-10-06 VITALS — BP 132/80 | HR 77 | Temp 97.5°F | Resp 16 | Ht 70.0 in | Wt 189.0 lb

## 2013-10-06 DIAGNOSIS — D490 Neoplasm of unspecified behavior of digestive system: Secondary | ICD-10-CM

## 2013-10-06 NOTE — Telephone Encounter (Signed)
I have not heard anything about an appointment for you at [facility].  I will check on that information and get back to you this week.

## 2013-10-06 NOTE — Patient Instructions (Addendum)
Revised [place] criteria are the generally accepted guidelines for IPMN pancreas (intraductal papillary mucinous neoplasm).  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  Please let us know if you have issues getting appt at [facility].    Please have them send [place] their records and we will send them our records.    At the very least, I would recommend repeat MRI in 6 months vs removal of the pancreatic tail.

## 2013-10-07 NOTE — Telephone Encounter (Signed)
Pt has been scheduled with Dr Prudence Davidson 10/15/13 10 am my chart message sent to the pt.

## 2013-10-08 DIAGNOSIS — D490 Neoplasm of unspecified behavior of digestive system: Secondary | ICD-10-CM | POA: Insufficient documentation

## 2013-10-08 NOTE — Progress Notes (Addendum)
Chief complaint:  Pancreatic cystic masses  HISTORY: Pt is a 73 yo M who had incidentally noted pancreatic masses.  He was diagnosed with COPD several years ago. When he went to see pulmonology, he ended up having a low dose screening CT scan of the chest to screen for lung cancer. 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  On a followup CT scan to evaluate for thymoma recurrence, he was noted to have a pancreatic cystic mass in the tail.  aan MRI was then performed which demonstrated multiple multilobulated cystic lesions in the body and tail of the pancreas which communicate with the pancreatic duct.  The largest lesion was 2.2 cm in the tail. The one in the body was 1.4 cm. There were other smaller ectatic cystic lesions some of which were definitely in the side branch location. There is a 9 mm lesion in the uncinate process.  He then had an endoscopic ultrasound by Dr. Ardis Hughs. The measurements on this imaging study were 1.4 cm and 1.8 cm. The uncinate process the lesion was 1 cm. The masses were described as with out solid masses or mural nodules. There were thin septae. The cyst fluid was milky and thin.  The main duct did communicate with the cyst but the main duct was not dilated.  The this fluid was sent for cytology, CEA, and amylase. The amylase was 57, the cytology demonstrated cyst fluid, glanular cells, and no atypical cells. He was found to have a CEA value of 7784. That was noted in the tail lesion.  Past Medical History  Diagnosis Date  . Essential hypertension, benign   . Hyperlipidemia   . History of colonic polyps   . Lightheadedness     workup for this about 1.5 years ago included a 3 week event monitor and an echo, which were both unremarkable. Of note, patient was no longer having lightheaded spells when he wore the monitor  . GERD (gastroesophageal reflux disease)   . Diverticulosis   . COPD (chronic obstructive pulmonary disease)     Past Surgical History  Procedure  Laterality Date  . Cataract extraction    . Eye surgery Right     muscle   . Video assisted thoracoscopy (vats)/wedge resection Left 09/03/2012    Procedure: VIDEO ASSISTED THORACOSCOPY (VATS) ,Resection Anterior Mediastinal Mass;  Surgeon: Grace Isaac, MD;  Location: [facility];  Service: Thoracic;  Laterality: Left;  . Flexible bronchoscopy N/A 09/03/2012    Procedure: FLEXIBLE BRONCHOSCOPY;  Surgeon: Grace Isaac, MD;  Location: [facility];  Service: Thoracic;  Laterality: N/A;  . Eus N/A 09/18/2013    Procedure: UPPER ENDOSCOPIC ULTRASOUND (EUS) LINEAR;  Surgeon: [name], MD;  Location: [facility];  Service: Endoscopy;  Laterality: N/A;  radial linear    Current Outpatient Prescriptions  Medication Sig Dispense Refill  . ALPRAZolam (XANAX) 0.25 MG tablet Take 0.25 mg by mouth daily as needed. For anxiety      . amLODipine (NORVASC) 5 MG tablet Take 2.5-5 mg by mouth 2 (two) times daily. 5mg  in the am and 2.5 mg in the pm      . aspirin EC 81 MG tablet Take 81 mg by mouth every evening.      Marland Kitchen atorvastatin (LIPITOR) 40 MG tablet Take 40 mg by mouth every evening.      . Calcium Carbonate-Vitamin D (CALCIUM + D PO) Take 1 tablet by mouth daily.      . Coenzyme Q10 (CO Q 10)  100 MG CAPS Take 200 mg by mouth daily.      . famotidine (PEPCID) 20 MG tablet Take 20 mg by mouth daily as needed for heartburn. For heartburn      . fluoruracil (CARAC) 0.5 % cream Apply topically daily.      Marland Kitchen ibuprofen (ADVIL,MOTRIN) 200 MG tablet Take 400 mg by mouth every 8 (eight) hours as needed for moderate pain.       Marland Kitchen lisinopril (PRINIVIL,ZESTRIL) 10 MG tablet Take 10 mg by mouth every morning.      . Multiple Vitamins-Minerals (CENTRUM SILVER PO) Take by mouth.      . tiotropium (SPIRIVA) 18 MCG inhalation capsule Place 1 capsule (18 mcg total) into inhaler and inhale daily.  90 capsule  0   No current facility-administered medications for this visit.     No Known Allergies   Family History   Problem Relation Age of Onset  . Coronary artery disease Neg Hx   . Emphysema Father   . Cancer Mother      History   Social History  . Marital Status: Married    Spouse Name: N/A    Number of Children: N/A  . Years of Education: N/A   Occupational History  . retired    Social History Main Topics  . Smoking status: Former Smoker -- 1.00 packs/day for 25 years    Types: Cigarettes    Quit date: 07/03/1982  . Smokeless tobacco: Never Used  . Alcohol Use: 7.0 oz/week    14 drink(s) per week  . Drug Use: No  . Sexual Activity: None   Other Topics Concern  . None   Social History Narrative  . None     REVIEW OF SYSTEMS - PERTINENT POSITIVES ONLY: 12 point review of systems negative other than HPI and PMH  EXAM: Filed Vitals:   10/06/13 1408  BP: 132/80  Pulse: 77  Temp: 97.5 F (36.4 C)  Resp: 16    Wt Readings from Last 3 Encounters:  10/06/13 189 lb (85.73 kg)  10/02/13 182 lb (82.555 kg)  09/18/13 182 lb (82.555 kg)     Gen:  No acute distress.  Well nourished and well groomed.   Neurological: Alert and oriented to person, place, and time. Coordination normal.  Head: Normocephalic and atraumatic.  Eyes: Conjunctivae are normal. Pupils are equal, round, and reactive to light. No scleral icterus.  Neck: Normal range of motion. Neck supple. No tracheal deviation or thyromegaly present.  Cardiovascular: Normal rate, regular rhythm, normal heart sounds and intact distal pulses.  Exam reveals no gallop and no friction rub.  No murmur heard. Respiratory: Effort normal.  No respiratory distress. No chest wall tenderness. Breath sounds normal.  No wheezes, rales or rhonchi.  GI: Soft. Bowel sounds are normal. The abdomen is soft and nontender.  There is no rebound and no guarding. small  Musculoskeletal: Normal range of motion. Extremities are nontender.  Lymphadenopathy: No cervical, preauricular, postauricular or axillary adenopathy is present Skin: Skin is  warm and dry. No rash noted. No diaphoresis. No erythema. No pallor. No clubbing, cyanosis, or edema.   Psychiatric: Normal mood and affect. Behavior is normal. Judgment and thought content normal.    LABORATORY RESULTS: Available labs are reviewed in epic.  None recent other than pathology  Recent Results (from the past 2160 hour(s))  PANC CYST FLD ANLYS-PATHFNDR-TG     Status: None   Collection Time    09/18/13  9:20 AM  Result Value Ref Range   Pathfinder TG, Pancreatic Cyst Fluid SPECIMEN SENT TO [facility]     Cytology Cytology results show cyst fluid, glandular cells, no atypical cells    RADIOLOGY RESULTS: See E-Chart or I-Site for most recent results.  Images and reports are reviewed.  MR/MRCP 08/22/2013 IMPRESSION:  Multi lobular cystic lesions involving the body and tail of the  pancreas which appear to communicate with the pancreatic duct are  concerning for intraductal papillary mucinous tumors. Differential  would also include chronic cystic change of pancreatitis. One small  lesion is the uncinate process. Lesion may be accessible for EUS  sampling. Recommend GI / surgical consultation.  CT chest IMPRESSION:  1. Stable small pulmonary nodules which are likely benign.  2. Status post resection of anterior mediastinal mass without  evidence for recurrence of tumor.  3. Interval change in the appearance of the distal tail of pancreas  which now exhibits abnormal soft tissue fullness and mild peripheral  indistinctness. Underlying pancreatic mass cannot be excluded on  this noncontrast CT through the upper abdomen. Recommend further  evaluation with contrast enhanced CT or MRI of the pancreas.  These results will be called to the ordering clinician or  representative by the Radiologist Assistant, and communication  documented in the PACS Dashboard. .    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 This does not appear to be straightforward.  In looking at the [place] criteria, he does not have any high-risk features or worrisome stigmata for cancer. He does however have multiple main duct IPMNs, which are primarily concentrated in the distal portion of the pancreas. These are all below 2 cm.  However, the CEA value in the tail lesion was quite high at 7784.   He additionally has a main branch IPMN in the uncinate process.  I do not think that his risk of pancreatic cancer is high enough for a total pancreatectomy.  It would be reasonable to do a distal pancreatectomy given the elevated CEA.  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 He is concerned about risks of surgery and potential alterations in his energy level and physical abilities.  He has seen and visited many families and patients in the course of their hospitalizations and home recovery, so he has a unique view of things that can go wrong.  He is getting another opinion at [facility] from the surgical oncology division.  I also discussed the possibility of going somewhere like [facility] or [address] with an extensive pancreatic cyst registry.  My bias would be to follow this at 6 months at the very least.  It is not clear from his original chest CT if these lesions were there. That was a low dose screening CT and the pancreas is less well-visualized than on his recent imaging study.  If he did not have a lesion in the body and uncinate process, I would recommend removing the distal portion of the pancreas. However with the remaining main branch IPMN in the head of the pancreas, I am not sure that his risk would fall enough with the removal of the distal portion.  I discussed what a laparoscopic distal pancreatectomy would entail. I also discussed that we would intend to leave the spleen in place. I discussed the principal risk including bleeding, infection,  pancreatic leak, possible ramifications of pancreatic leak including chronic nausea, pleural effusion, abdominal pain, and  possible prolonged hospitalization and recovery period.       Milus Height MD Surgical Oncology, General and [facility]      Visit Diagnoses: 1. 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     Primary Care Physician: Donnajean Lopes, MD   Laurell Roof, Ed.

## 2013-10-08 NOTE — Assessment & Plan Note (Signed)
This does not appear to be straightforward.  In looking at the [place] criteria, he does not have any high-risk features or worrisome stigmata for cancer. He does however have multiple main duct IPMNs, which are primarily concentrated in the distal portion of the pancreas. These are all below 2 cm.  However, the CEA value in the tail lesion was quite high at 7784.   He additionally has a main branch IPMN in the uncinate process.  I do not think that his risk of pancreatic cancer is high enough for a total pancreatectomy.  It would be reasonable to do a distal pancreatectomy given the elevated CEA.  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 He is concerned about risks of surgery and potential alterations in his energy level and physical abilities.  He has seen and visited many families and patients in the course of their hospitalizations and home recovery, so he has a unique view of things that can go wrong.  He is getting another opinion at [facility] from the surgical oncology division.  I also discussed the possibility of going somewhere like [facility] or [place] with an extensive pancreatic cyst registry.  My bias would be to follow this at 6 months at the very least.  It is not clear from his original chest CT if these lesions were there. That was a low dose screening CT and the pancreas is less well-visualized than on his recent imaging study.  If he did not have a lesion in the uncinate process, I would recommend removing the distal portion of the pancreas. However with the remaining main branch IPMN in the head of the pancreas, I am not sure that his risk would fall enough with the removal of the distal portion.  I discussed what a laparoscopic distal pancreatectomy would entail. I also discussed that we would intend to leave the spleen in place. I discussed the principal risk including bleeding, infection, pancreatic leak, possible  ramifications of pancreatic leak including chronic nausea, pleural effusion, abdominal pain, and possible prolonged hospitalization and recovery period.

## 2013-10-09 ENCOUNTER — Encounter: Payer: Self-pay | Admitting: Gastroenterology

## 2013-10-09 ENCOUNTER — Encounter (INDEPENDENT_AMBULATORY_CARE_PROVIDER_SITE_OTHER): Payer: Self-pay | Admitting: General Surgery

## 2013-10-13 ENCOUNTER — Encounter (INDEPENDENT_AMBULATORY_CARE_PROVIDER_SITE_OTHER): Payer: Self-pay | Admitting: General Surgery

## 2013-10-14 ENCOUNTER — Telehealth (INDEPENDENT_AMBULATORY_CARE_PROVIDER_SITE_OTHER): Payer: Self-pay

## 2013-10-14 NOTE — Telephone Encounter (Signed)
Notified pt by e-mail that records for [facility]/Dr. Lazarus Gowda, will be faxed today.

## 2013-10-15 DIAGNOSIS — D490 Neoplasm of unspecified behavior of digestive system: Secondary | ICD-10-CM | POA: Diagnosis not present

## 2013-11-10 ENCOUNTER — Other Ambulatory Visit: Payer: Self-pay | Admitting: Internal Medicine

## 2014-02-17 ENCOUNTER — Other Ambulatory Visit: Payer: Self-pay

## 2014-02-17 DIAGNOSIS — L819 Disorder of pigmentation, unspecified: Secondary | ICD-10-CM | POA: Diagnosis not present

## 2014-02-17 DIAGNOSIS — D485 Neoplasm of uncertain behavior of skin: Secondary | ICD-10-CM | POA: Diagnosis not present

## 2014-02-17 DIAGNOSIS — L988 Other specified disorders of the skin and subcutaneous tissue: Secondary | ICD-10-CM | POA: Diagnosis not present

## 2014-02-17 DIAGNOSIS — D1801 Hemangioma of skin and subcutaneous tissue: Secondary | ICD-10-CM | POA: Diagnosis not present

## 2014-02-17 DIAGNOSIS — L821 Other seborrheic keratosis: Secondary | ICD-10-CM | POA: Diagnosis not present

## 2014-02-17 DIAGNOSIS — L570 Actinic keratosis: Secondary | ICD-10-CM | POA: Diagnosis not present

## 2014-03-16 ENCOUNTER — Encounter: Payer: Self-pay | Admitting: Internal Medicine

## 2014-03-25 DIAGNOSIS — Z23 Encounter for immunization: Secondary | ICD-10-CM | POA: Diagnosis not present

## 2014-04-15 DIAGNOSIS — K862 Cyst of pancreas: Secondary | ICD-10-CM | POA: Diagnosis not present

## 2014-04-15 DIAGNOSIS — D490 Neoplasm of unspecified behavior of digestive system: Secondary | ICD-10-CM | POA: Diagnosis not present

## 2014-04-27 DIAGNOSIS — K862 Cyst of pancreas: Secondary | ICD-10-CM | POA: Diagnosis not present

## 2014-05-21 DIAGNOSIS — Z961 Presence of intraocular lens: Secondary | ICD-10-CM | POA: Diagnosis not present

## 2014-05-22 ENCOUNTER — Other Ambulatory Visit: Payer: Self-pay | Admitting: *Deleted

## 2014-05-22 MED ORDER — TIOTROPIUM BROMIDE MONOHYDRATE 18 MCG IN CAPS: ORAL_CAPSULE | RESPIRATORY_TRACT | Status: DC

## 2014-05-24 ENCOUNTER — Telehealth: Payer: Self-pay | Admitting: Internal Medicine

## 2014-05-25 MED ORDER — TIOTROPIUM BROMIDE MONOHYDRATE 18 MCG IN CAPS: ORAL_CAPSULE | RESPIRATORY_TRACT | Status: DC

## 2014-05-25 NOTE — Telephone Encounter (Signed)
[facility] with no refills on rx.  mychart message replied to pt, letting him know that rx has been sent but needs ov and to call our office at his earliest convenience to schedule ov.  Nothing further needed.

## 2014-06-05 ENCOUNTER — Ambulatory Visit (INDEPENDENT_AMBULATORY_CARE_PROVIDER_SITE_OTHER): Payer: Medicare Other | Admitting: Internal Medicine

## 2014-06-05 ENCOUNTER — Encounter: Payer: Self-pay | Admitting: Internal Medicine

## 2014-06-05 VITALS — BP 138/92 | HR 72 | Ht 70.0 in | Wt 197.0 lb

## 2014-06-05 DIAGNOSIS — D150 Benign neoplasm of thymus: Secondary | ICD-10-CM | POA: Diagnosis not present

## 2014-06-05 DIAGNOSIS — J449 Chronic obstructive pulmonary disease, unspecified: Secondary | ICD-10-CM

## 2014-06-05 DIAGNOSIS — R0982 Postnasal drip: Secondary | ICD-10-CM | POA: Insufficient documentation

## 2014-06-05 NOTE — Patient Instructions (Addendum)
#  Mild emphysema  - currently stable  - continue  spiriva. - call in Jan 2016 for 1 year mail order prescription  - use albuterol 2 puff as needed; take script - if you get worse, call us  #Post nasal drip  -  take generic fluticasone inhaler 2 squirts each nostril daily (curently given sample of ciclesonide)   #Lung nodule and thymoma followup  - no recurrence  CT scan chest with contrast mid feb 2015 - do next CT chest wo contrast feb 2016 (08/20/14) - you will  Need to call DR Servando Snare to set up followup for the scan   #Followup  -  - otherwise, 9 months for copd

## 2014-06-05 NOTE — Progress Notes (Signed)
Subjective:    Patient ID: Michael [name], male    DOB: March 08, 1941, 73 y.o.   MRN: [id number]  HPI  #Thymoma stage I  - Diagnosed during low-dose CT scan of the chest screening program for lung cancer - Status post complete resection without residual disease September 03, 2012 - Followup Dr Servando Snare  #COPD mild, Gold stage I versus mild asthma - COPD cat score 4 on 10/08/2012  - Pulmonary function test done February 2014: Prebronchodilator FEV1 while on Spiriva 2.4 L/76%. Postbronchodilator FEV1 2.6 L/82% with FEV1 FEC ratio of 69 prebronchodilator and 76 post bronchodilator. Diffusion capacity 90%   #Pulmonary nodule - 3 mm lung nodule right upper lobe and left upper lobe diagnosed February 2014 during CT scan chest screening program for lung cancer   OV 10/08/2012  Followup for all of the above. He is now status post resection of thymoma which is now stage I. Looks like she will have annual surveillance CT scan of the chest with Dr. Servando Snare He is doing well. In retrospect he recollects retrosternal pain that is now completely resolved after surgery. He also does have some fatigue which is better. He denies any myasthenia symptoms. He continues with Spiriva and he finds good relief. I reviewed his pulmonary function test and is more suggestive of asthma because of normal diffusion capacity but he is content taking Spiriva and does not want to switch.  Currently he is putting his life back together and wants to focus on fitness by joining the [facility]  There are no new issues   OV 07/31/2013  Chief Complaint  Patient presents with  . Follow-up    Pt states he feels SOB is improved since last OV. Pt c/o having some PND, and dry mouth.     Followup for  - Emphysema mild: Currently stable. He is using Spiriva which he likes except for the fact that it might be associated with dry mouth [see below]. COPD cat score is 10 and reflects mild symptom burden.   - Lung nodule: He is due for  his serial CT scan of the chest in mid February 2015. This will mark 1 year followup  - Thymoma: Stage I. Status post resection. He is due for his one-year CT scan of the chest but February 2015 and this needs to be arranged  -- New complaint: Started Spiriva 8-9 months ago. However for the last 4 months even in the fall in the summer started noticing severe dry mouth especially at night. This is not associated with dry eyes. It is there daily. It is not progressive. It is relieved by drinking water. It is associated with Spiriva intake. His nose is her autoimmune symptoms or any other associated symptoms. This no radiation s    REC #Dry mouth   - stop spiriva for 3 weeks   #Mild emphysema  - currently stable  - due to above issue, stop spiriva.   - use albuterol 2 puff as needed; take script 0- if you get worse, call us  #Lung nodule and thymoma followup  - do CT scan chest with contrast mid feb 2015  #Followup  3weeks to assess followup of above issues  OV 08/22/2013  Chief Complaint  Patient presents with  . Follow-up    to review CT results. Pt states cough and sob has improved with spiriva.     Fu several issues  - dry mouth: did not improve after dc spiriva. So went back on spiriva. Advised  biotene mouth wash and he is better. He is due to see dentist.   - emphysema: got worse after he stopped spiriva but improved and back to baseline after he went back on spiriva  - lung nodule and thymoma; had 1 year CT chest that shows no recurence and nodule stability  - New issue: CT chest feb 2015 shows possible mass lesion tail of pancreas and more advnaced imaging recommended. He prefers I d/w Dr Henrene Pastor his GI first before ordering advanced imaging   Ct Chest Wo Contrast  08/20/2013   CLINICAL DATA:  Routine followup lung nodule  EXAM: CT CHEST WITHOUT CONTRAST  TECHNIQUE: Multidetector CT imaging of the chest was performed following the standard protocol without IV contrast.   COMPARISON:  08/19/2012  FINDINGS: No pleural effusion identified. There is no airspace consolidation. Scarring noted within the posterior lung bases. Scar is identified within the left upper lobe, image 19/series 3 stable tiny left upper lobe nodule measuring 3 mm, image 18/series 3. Right upper lobe nodule measures 3 mm and is unchanged from previous exam, image 22/series 3. No new or enlarging pulmonary nodules or masses. 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 No evidence for recurrence of tumor. Calcified atherosclerotic change involves the thoracic aorta. There is no aneurysm. There is also a calcification within the LAD coronary artery. No mediastinal or hilar adenopathy noted. There is no axillary or supraclavicular adenopathy.  Incidental imaging through the upper abdomen shows no acute findings. However, there is a change in the appearance of the distal tail of pancreas. An area of soft tissue fullness and mild peripheral and distinctness measures 2.5 cm, image 57/series 2.  IMPRESSION: 1. Stable small pulmonary nodules which are likely benign. 2. Status post resection of anterior mediastinal mass without evidence for recurrence of tumor. 3. Interval change in the appearance of the distal tail of pancreas which now exhibits abnormal soft tissue fullness and mild peripheral indistinctness. Underlying pancreatic mass cannot be excluded on this noncontrast CT through the upper abdomen. Recommend further evaluation with contrast enhanced CT or MRI of the pancreas. These results will be called to the ordering clinician or representative by the Radiologist Assistant, and communication documented in the PACS Dashboard. .   Electronically Signed   By: Kerby Moors M.D.   On: 08/20/2013 15:59    OV 06/05/2014  Chief Complaint  Patient presents with  . Follow-up    Pt c/o of recent chest cold but resolved with abx that was given for a dental procedure. Pt denies change in SOB and c/o  prod cough with clear mucus. Pt denies CP/tightness.      Fu several issues  - dry mouth: did not improve after dc spiriva. So went back on spiriva.Still persists, mild. Has learned to live wit hit  - emphysema: got worse after he stopped spiriva but improved and back to baseline after he went back on spiriva. He says uptodate with vaccines. Wants 1 year refill on spiriva starting jan 2016  - Pst nasal drip: mild but more active in winter  - lung nodule and thymoma; had 1 year CT chest that shows no recurence and nodule stability in feb 2015. 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 He is now fu at [facility] with Q6 month MRI. Not malignant apparently  Immunization History  Administered Date(s) Administered  . DT 02/09/2009  . Influenza  Split 05/03/2012, 05/03/2013, 05/03/2014  . Pneumococcal Conjugate-13 06/26/2013  . Pneumococcal Polysaccharide-23 07/03/2010  . Zoster 09/08/2010     Review of Systems  Constitutional: Negative for fever and unexpected weight change.  HENT: Negative for congestion, dental problem, ear pain, nosebleeds, postnasal drip, rhinorrhea, sinus pressure, sneezing, sore throat and trouble swallowing.   Eyes: Negative for redness and itching.  Respiratory: Positive for cough and shortness of breath. Negative for chest tightness and wheezing.   Cardiovascular: Negative for palpitations and leg swelling.  Gastrointestinal: Negative for nausea and vomiting.  Genitourinary: Negative for dysuria.  Musculoskeletal: Negative for joint swelling.  Skin: Negative for rash.  Neurological: Negative for headaches.  Hematological: Does not bruise/bleed easily.  Psychiatric/Behavioral: Negative for dysphoric mood. The patient is not nervous/anxious.    Current outpatient prescriptions: ALPRAZolam (XANAX) 0.25 MG tablet, Take 0.25 mg by mouth daily as needed. For anxiety, Disp: , Rfl:  ;  amLODipine (NORVASC) 5 MG tablet, Take 2.5-5 mg by mouth 2 (two) times daily. 5mg  in the am and 2.5 mg in the pm, Disp: , Rfl: ;  aspirin EC 81 MG tablet, Take 81 mg by mouth every evening., Disp: , Rfl: ;  atorvastatin (LIPITOR) 40 MG tablet, Take 40 mg by mouth every evening., Disp: , Rfl:  Calcium Carbonate-Vitamin D (CALCIUM + D PO), Take 1 tablet by mouth daily., Disp: , Rfl: ;  Coenzyme Q10 (CO Q 10) 100 MG CAPS, Take 200 mg by mouth daily., Disp: , Rfl: ;  famotidine (PEPCID) 20 MG tablet, Take 20 mg by mouth daily as needed for heartburn. For heartburn, Disp: , Rfl: ;  ibuprofen (ADVIL,MOTRIN) 200 MG tablet, Take 400 mg by mouth every 8 (eight) hours as needed for moderate pain. , Disp: , Rfl:  lisinopril (PRINIVIL,ZESTRIL) 10 MG tablet, Take 10 mg by mouth every morning., Disp: , Rfl: ;  Multiple Vitamins-Minerals (CENTRUM SILVER PO), Take by mouth., Disp: , Rfl: ;  tiotropium (SPIRIVA HANDIHALER) 18 MCG inhalation capsule, Inhale the contents of 1  capsule via HandiHaler  Daily. Pt needs to call and schedule an appointment for more refills., Disp: 90 capsule, Rfl: 0      Objective:   Physical Exam  Constitutional: He is oriented to person, place, and time. He appears well-developed and well-nourished. No distress.  HENT:  Head: Normocephalic and atraumatic.  Right Ear: External ear normal.  Left Ear: External ear normal.  Mouth/Throat: Oropharynx is clear and moist. No oropharyngeal exudate.  Post nasal drip  Eyes: Conjunctivae and EOM are normal. Pupils are equal, round, and reactive to light. Right eye exhibits no discharge. Left eye exhibits no discharge. No scleral icterus.  Neck: Normal range of motion. Neck supple. No JVD present. No tracheal deviation present. No thyromegaly present.  Cardiovascular: Normal rate, regular rhythm and intact distal pulses.  Exam reveals no gallop and no friction rub.   No murmur heard. Pulmonary/Chest: Effort normal and breath sounds normal. No  respiratory distress. He has no wheezes. He has no rales. He exhibits no tenderness.  Abdominal: Soft. Bowel sounds are normal. He exhibits no distension and no mass. There is no tenderness. There is no rebound and no guarding.  Musculoskeletal: Normal range of motion. He exhibits no edema or tenderness.  Lymphadenopathy:    He has no cervical adenopathy.  Neurological: He is alert and oriented to person, place, and time. He has normal reflexes. No cranial nerve deficit. Coordination normal.  Skin: Skin is warm and dry. No rash noted.  He is not diaphoretic. No erythema. No pallor.  Psychiatric: He has a normal mood and affect. His behavior is normal. Judgment and thought content normal.  Nursing note and vitals reviewed.    Filed Vitals:   06/05/14 0947  BP: 138/92  Pulse: 72  Height: 5\' 10"  (1.778 m)  Weight: 197 lb (89.359 kg)  SpO2: 97%        Assessment & Plan:     ICD-9-CM ICD-10-CM   1. Thymoma, benign 212.6 D15.0   2. Chronic obstructive pulmonary disease, unspecified COPD, unspecified chronic bronchitis type 496 J44.9   3. Post-nasal drip 784.91 R09.82    \ #Mild emphysema  - currently stable  - continue  spiriva. - call in Jan 2016 for 1 year mail order prescription  - use albuterol 2 puff as needed; take script - if you get worse, call us  #Post nasal drip  -  take generic fluticasone inhaler 2 squirts each nostril daily (curently given sample of ciclesonide)   #Lung nodule and thymoma followup  - no recurrence  CT scan chest with contrast mid feb 2015 - do next CT chest wo contrast feb 2016 (08/20/14) - you will  Need to call DR Servando Snare to set up followup for the scan   #Followup  -  - otherwise, 9 months for copd   Dr. Brand Males, M.D., [facility].C.P Pulmonary and Critical Care Medicine Staff Physician [place] [facility] Pager: [phone], If no answer or between  15:00h - 7:00h: call [phone]  06/05/2014 10:39 AM

## 2014-06-09 ENCOUNTER — Telehealth: Payer: Self-pay | Admitting: Internal Medicine

## 2014-06-09 NOTE — Telephone Encounter (Signed)
He said he had accepted fact spiriva was cause of dry mouth and without it dyspnea was getting worse. Due to donut hole issue I gave him sample of INCRUSE (could not find sample of spiriva on that day). Now it seems he likes INCRUSE. Recommend you give 8 week sample of incruse - he can trial it and then if he likes it we can prescribe it after he runs out of his spiriva

## 2014-06-09 NOTE — Telephone Encounter (Signed)
Had called pt as courtesy call regarding 12.4.15 ov w/ MR Pt had additional question/concern regarding Incruse Ellipta >> this did improve his dry mouth that was felt to be from the Spiriva but pt is in the donut hole and just paid $400+ for his 65month supply of Spiriva.  Pt would like another sample or two of the Incruse to ensure this does in fact help or try a 30day supply sent to a local pharmacy.  However, the last ov note asks pt to continue his Spiriva.  Pt is aware we will need to check with MR - pt would like triage to respond via mychart message. Dr Chase Caller please advise, thank you.  Patient Instructions        #Mild emphysema  - currently stable  - continue  spiriva. - call in Jan 2016 for 1 year mail order prescription  - use albuterol 2 puff as needed; take script - if you get worse, call us  #Post nasal drip  -  take generic fluticasone inhaler 2 squirts each nostril daily (curently given sample of ciclesonide)   #Lung nodule and thymoma followup  - no recurrence  CT scan chest with contrast mid feb 2015 - do next CT chest wo contrast feb 2016 (08/20/14) - you will  Need to call DR Servando Snare to set up followup for the scan   #Followup  -  - otherwise, 9 months for copd

## 2014-06-10 NOTE — Telephone Encounter (Signed)
Responded via My Chart message letting pt know of samples and the continuing trial of Incruse and we can send a script in. Only able to leave 2 weeks worth of samples d/t availability.Will keep message open till pt responds via MyChart message.

## 2014-06-12 DIAGNOSIS — R358 Other polyuria: Secondary | ICD-10-CM | POA: Diagnosis not present

## 2014-06-12 DIAGNOSIS — Z0000 Encounter for general adult medical examination without abnormal findings: Secondary | ICD-10-CM | POA: Diagnosis not present

## 2014-06-12 DIAGNOSIS — E785 Hyperlipidemia, unspecified: Secondary | ICD-10-CM | POA: Diagnosis not present

## 2014-06-12 DIAGNOSIS — Z125 Encounter for screening for malignant neoplasm of prostate: Secondary | ICD-10-CM | POA: Diagnosis not present

## 2014-06-12 DIAGNOSIS — I10 Essential (primary) hypertension: Secondary | ICD-10-CM | POA: Diagnosis not present

## 2014-06-12 DIAGNOSIS — M859 Disorder of bone density and structure, unspecified: Secondary | ICD-10-CM | POA: Diagnosis not present

## 2014-06-12 NOTE — Telephone Encounter (Signed)
Pt responded and stated he will come get the samples. Pho[place] Bing, CMA

## 2014-06-19 DIAGNOSIS — R5383 Other fatigue: Secondary | ICD-10-CM | POA: Diagnosis not present

## 2014-06-19 DIAGNOSIS — M8580 Other specified disorders of bone density and structure, unspecified site: Secondary | ICD-10-CM | POA: Diagnosis not present

## 2014-06-19 DIAGNOSIS — Z0000 Encounter for general adult medical examination without abnormal findings: Secondary | ICD-10-CM | POA: Diagnosis not present

## 2014-06-19 DIAGNOSIS — Z6828 Body mass index (BMI) 28.0-28.9, adult: Secondary | ICD-10-CM | POA: Diagnosis not present

## 2014-06-19 DIAGNOSIS — Z1389 Encounter for screening for other disorder: Secondary | ICD-10-CM | POA: Diagnosis not present

## 2014-06-19 DIAGNOSIS — N529 Male erectile dysfunction, unspecified: Secondary | ICD-10-CM | POA: Diagnosis not present

## 2014-06-19 DIAGNOSIS — J449 Chronic obstructive pulmonary disease, unspecified: Secondary | ICD-10-CM | POA: Diagnosis not present

## 2014-06-19 DIAGNOSIS — I10 Essential (primary) hypertension: Secondary | ICD-10-CM | POA: Diagnosis not present

## 2014-06-19 DIAGNOSIS — E785 Hyperlipidemia, unspecified: Secondary | ICD-10-CM | POA: Diagnosis not present

## 2014-06-22 DIAGNOSIS — Z1212 Encounter for screening for malignant neoplasm of rectum: Secondary | ICD-10-CM | POA: Diagnosis not present

## 2014-08-06 ENCOUNTER — Encounter: Payer: Self-pay | Admitting: Cardiology

## 2014-08-06 ENCOUNTER — Ambulatory Visit (INDEPENDENT_AMBULATORY_CARE_PROVIDER_SITE_OTHER): Payer: Medicare Other | Admitting: Cardiology

## 2014-08-06 VITALS — BP 128/78 | HR 71 | Ht 70.0 in | Wt 196.0 lb

## 2014-08-06 DIAGNOSIS — I10 Essential (primary) hypertension: Secondary | ICD-10-CM

## 2014-08-06 DIAGNOSIS — R0789 Other chest pain: Secondary | ICD-10-CM | POA: Diagnosis not present

## 2014-08-06 DIAGNOSIS — E785 Hyperlipidemia, unspecified: Secondary | ICD-10-CM

## 2014-08-06 NOTE — Patient Instructions (Signed)
Your physician wants you to follow-up in: 1 year with Dr Aundra Dubin. (February 2017).  You will receive a reminder letter in the mail two months in advance. If you don't receive a letter, please call our office to schedule the follow-up appointment.

## 2014-08-08 NOTE — Progress Notes (Signed)
Patient ID: COSTA JHA, male   DOB: December 13, 1940, 74 y.o.   MRN: [id number] PCP: Dr. Philip Aspen  74 yo with history of HTN, hyperlipidemia, COPD, and spells of lightheadedness presents for cardiology followup. In 3/12, he had an ETT-myoview for atypical chest pain that showed no evidence for ischemia or infarction. He has mild exertional dyspnea, better now that he takes Spiriva for his COPD.  No chest pain.  He is still working and is fairly active.  He can walk 2 miles without any problems.  He has an occasional mild dry cough.   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  Biopsy showed intraductal papillary mucinous neoplasm.  He has been [facility] and the plan has been serial followup by MRI for now.  ECG: NSR, sinus arrhythmia  Labs (11/11): LDL 144, HDL 68, creatinine 1.2 Labs (4/12): LDL 106, HDL 68 Labs (8/14): LDL 71, HDL 54  Allergies (verified):  No Known Drug Allergies  Past Medical History: 1. HTN 2. History of lightheadedness.  Workup for this about 1.5 years ago (Dr. Wynonia Lawman) included a 3 week event monitor and an echo, which were both unremarkable.  Of note, patient was no longer having lightheaded spells when he wore the monitor.  3.  Hyperlipidemia 4.  Colonic polyps 5.  Chest pain: ETT-myoview (3/12) with 9'30" exercise, no ischemic ECG changes, no evidence for ischemia or infarction by perfusion images, EF 64%.  6.  GERD 7. COPD 8. 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  Followed at [facility].   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  He does help out there fairly frequently.  Smoked about 1 ppd but quit in 1984.  Married.  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   Current Outpatient Prescriptions  Medication Sig Dispense Refill  . ALPRAZolam (XANAX) 0.25 MG tablet Take 0.25 mg by mouth  daily as needed. For anxiety    . amLODipine (NORVASC) 5 MG tablet Take 2.5-5 mg by mouth 2 (two) times daily. 5mg  in the am and 2.5 mg in the pm    . aspirin EC 81 MG tablet Take 81 mg by mouth every evening.    Marland Kitchen atorvastatin (LIPITOR) 40 MG tablet Take 40 mg by mouth every evening.    . Coenzyme Q10 (CO Q 10) 100 MG CAPS Take 200 mg by mouth daily.    . famotidine (PEPCID) 20 MG tablet Take 20 mg by mouth daily as needed for heartburn. For heartburn    . ibuprofen (ADVIL,MOTRIN) 200 MG tablet Take 400 mg by mouth every 8 (eight) hours as needed for moderate pain.     Marland Kitchen lisinopril (PRINIVIL,ZESTRIL) 10 MG tablet Take 10 mg by mouth every morning.    . Multiple Vitamins-Minerals (CENTRUM SILVER PO) Take by mouth.    . tiotropium (SPIRIVA HANDIHALER) 18 MCG inhalation capsule Inhale the contents of 1  capsule via HandiHaler  Daily. Pt needs to call and schedule an appointment for more refills. 90 capsule 0  . Calcium Carbonate-Vitamin D (CALCIUM + D PO) Take 1 tablet by mouth daily.     No current facility-administered medications for this visit.    BP 128/78 mmHg  Pulse 71  Ht 5\' 10"  (1.778 m)  Wt 196 lb (88.905 kg)  BMI 28.12 kg/m2 General:  Well developed, well  nourished, in no acute distress. Overweight.  Neck:  Neck supple, no JVD. No masses, thyromegaly or abnormal cervical nodes. Lungs:  Clear bilaterally to auscultation and percussion. Heart:  Non-displaced PMI, chest non-tender; regular rate and rhythm, S1, S2 without murmurs, rubs or gallops. Carotid upstroke normal, no bruit.  Pedals normal pulses. No edema, no varicosities. Abdomen:  Bowel sounds positive; abdomen soft and non-tender without masses, organomegaly, or hernias noted. No hepatosplenomegaly. Extremities:  No clubbing or cyanosis. Neurologic:  Alert and oriented x 3. Psych:  Normal affect.  Assessment/Plan: 1. Chest pain: No recent chest pain.  ETT-myoview in 3/12.  Continue ASA 81.  2. Hyperlipidemia:Good lipids  in 8/14.  Continue atorvastatin at current dose. Will get copy of most recent lipids from PCP.     3. HTN: BP has been under good control. He has an occasional mild dry cough that may be from lisinopril.  It is not bothering him much, and he would rather continue lisinopril than try something new.  4. COPD: Patient has been diagnosed by PFTs and has had improvement in dyspnea with Spiriva.  Loralie Champagne 08/08/2014

## 2014-08-14 ENCOUNTER — Other Ambulatory Visit: Payer: Self-pay | Admitting: Internal Medicine

## 2014-08-14 ENCOUNTER — Other Ambulatory Visit: Payer: Self-pay | Admitting: Cardiology

## 2014-08-20 ENCOUNTER — Ambulatory Visit (INDEPENDENT_AMBULATORY_CARE_PROVIDER_SITE_OTHER)
Admission: RE | Admit: 2014-08-20 | Discharge: 2014-08-20 | Disposition: A | Discharge status: Home / Self Care | Payer: Medicare Other | Source: Ambulatory Visit | Attending: Internal Medicine | Admitting: Internal Medicine

## 2014-08-20 DIAGNOSIS — Z8589 Personal history of malignant neoplasm of other organs and systems: Secondary | ICD-10-CM | POA: Diagnosis not present

## 2014-08-20 DIAGNOSIS — R911 Solitary pulmonary nodule: Secondary | ICD-10-CM

## 2014-08-20 DIAGNOSIS — J984 Other disorders of lung: Secondary | ICD-10-CM | POA: Diagnosis not present

## 2014-08-20 DIAGNOSIS — R918 Other nonspecific abnormal finding of lung field: Secondary | ICD-10-CM | POA: Diagnosis not present

## 2014-08-20 DIAGNOSIS — I2510 Atherosclerotic heart disease of native coronary artery without angina pectoris: Secondary | ICD-10-CM | POA: Diagnosis not present

## 2014-08-22 ENCOUNTER — Encounter: Payer: Self-pay | Admitting: Cardiology

## 2014-08-24 ENCOUNTER — Other Ambulatory Visit: Payer: Self-pay | Admitting: *Deleted

## 2014-08-24 DIAGNOSIS — E785 Hyperlipidemia, unspecified: Secondary | ICD-10-CM

## 2014-08-24 MED ORDER — ROSUVASTATIN CALCIUM 10 MG PO TABS: 10.0000 mg | ORAL_TABLET | Freq: Every day | ORAL | Status: DC

## 2014-09-01 DIAGNOSIS — M859 Disorder of bone density and structure, unspecified: Secondary | ICD-10-CM | POA: Diagnosis not present

## 2014-09-07 ENCOUNTER — Encounter: Payer: Self-pay | Admitting: Internal Medicine

## 2014-09-10 ENCOUNTER — Encounter: Payer: Self-pay | Admitting: Internal Medicine

## 2014-10-08 ENCOUNTER — Ambulatory Visit (INDEPENDENT_AMBULATORY_CARE_PROVIDER_SITE_OTHER): Payer: Medicare Other | Admitting: Cardiothoracic Surgery

## 2014-10-08 ENCOUNTER — Encounter: Payer: Self-pay | Admitting: Cardiothoracic Surgery

## 2014-10-08 VITALS — BP 150/90 | HR 67 | Resp 20 | Ht 70.0 in | Wt 195.0 lb

## 2014-10-08 DIAGNOSIS — D150 Benign neoplasm of thymus: Secondary | ICD-10-CM

## 2014-10-08 DIAGNOSIS — R918 Other nonspecific abnormal finding of lung field: Secondary | ICD-10-CM

## 2014-10-08 NOTE — Progress Notes (Signed)
[facility] Record #[id number] Date of Birth: 1940/11/15  Brand Males, MD Donnajean Lopes, MD  Chief Complaint:   PostOp Follow Up Visit 09/03/2012   OPERATIVE REPORT  PREOPERATIVE DIAGNOSIS: Anterior mediastinal mass.  POSTOPERATIVE DIAGNOSIS: Anterior mediastinal mass. Final path  pending.  SURGICAL PROCEDURE: Left video-assisted thoracoscopy and complete  resection of anterior mediastinal mass and bronchoscopy.   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 He's done well postoperatively returning to near-normal activities already. Now  More then  24 months postop.  CT scan of the chest to followup on lung nodules and his previous mediastinal resection raised the issue of abnormality in the pancreas. Further evaluation by the MRI and by GI, patient has been followed at [facility] for this and has MRI of the pancreas scheduled for next week.  Follow-up CT scan of the chest was done by pulmonary approximate 6 weeks ago and reviewed today.  Patient has no specific complaints  History  Smoking status  . Former Smoker -- 1.00 packs/day for 25 years  . Types: Cigarettes  . Quit date: 07/03/1982  Smokeless tobacco  . Never Used       Allergies  Allergen Reactions  . Atorvastatin Other (See Comments)    Dry mouth    Current Outpatient Prescriptions  Medication Sig Dispense Refill  . ALPRAZolam (XANAX) 0.25 MG tablet Take 0.25 mg by mouth daily as needed. For anxiety    . amLODipine (NORVASC) 5 MG tablet Take 1 and 1/2 tablets  (7.5mg ) by mouth daily 135 tablet 3  . aspirin EC 81 MG tablet Take 81 mg by mouth every evening.    . Calcium Carbonate-Vitamin D (CALCIUM + D PO) Take 1 tablet by mouth daily.    . Coenzyme Q10  (CO Q 10) 100 MG CAPS Take 200 mg by mouth daily.    . famotidine (PEPCID) 20 MG tablet Take 20 mg by mouth daily as needed for heartburn. For heartburn    . ibuprofen (ADVIL,MOTRIN) 200 MG tablet Take 400 mg by mouth every 8 (eight) hours as needed for moderate pain.     Marland Kitchen lisinopril (PRINIVIL,ZESTRIL) 10 MG tablet Take 1 tablet by mouth  daily 90 tablet 3  . Multiple Vitamins-Minerals (CENTRUM SILVER PO) Take by mouth.    . rosuvastatin (CRESTOR) 10 MG tablet Take 1 tablet (10 mg total) by mouth daily. 30 tablet 3  . tiotropium (SPIRIVA HANDIHALER) 18 MCG inhalation capsule Inhale the contents of 1  capsule via HandiHaler  Daily. Pt needs to call and schedule an appointment for more refills. 90 capsule 0   No current facility-administered medications for this visit.       Physical Exam: BP 150/90 mmHg  Pulse 67  Resp 20  Ht 5\' 10"  (1.778 m)  Wt 195 lb (88.451 kg)  BMI 27.98 kg/m2  SpO2 98%  General appearance: alert and cooperative Neurologic: intact Heart: regular rate and rhythm, S1, S2 normal, no murmur, click, rub or gallop and normal apical impulse Lungs: clear to auscultation bilaterally and normal percussion bilaterally Abdomen: soft, non-tender; bowel sounds normal; no masses,  no organomegaly Extremities: extremities  normal, atraumatic, no cyanosis or edema and Homans sign is negative, no sign of DVT Wound: His left chest incisions are all well-healed    Diagnostic Studies & Laboratory data:         Recent Radiology Findings: CLINICAL DATA: 74 year old male with history of thymoma. Lung nodule. One year follow-up examination.  EXAM: CT CHEST WITHOUT CONTRAST  TECHNIQUE: Multidetector CT imaging of the chest was performed following the standard protocol without IV contrast.  COMPARISON: Chest CT 08/20/2013.  FINDINGS: Mediastinum/Lymph Nodes: Heart size is normal. There is no significant pericardial fluid, thickening or pericardial calcification.  There is atherosclerosis of the thoracic aorta, the great vessels of the mediastinum and the coronary arteries, including calcified atherosclerotic plaque in the left main, left anterior descending and right coronary arteries. No soft tissue mass in the anterior mediastinum to suggest recurrent thymoma. No pathologically enlarged mediastinal or hilar lymph nodes. Please note that accurate exclusion of hilar adenopathy is limited on noncontrast CT scans. Esophagus is unremarkable in appearance. No axillary lymphadenopathy.  Lungs/Pleura: Again noted is a pleural-based scar in the anterior aspect of the left upper lobe, which is similar to numerous prior examinations and considered benign. 3 mm right upper lobe nodule (image 24 of series 3) is unchanged. Previously noted 3 mm pleural-based nodule in the left upper lobe is less apparent, noted on image 19 of series 3 on today's examination, presumably a benign subpleural lymph node. No other suspicious appearing pulmonary nodules or masses are otherwise noted. No pleural-based nodules or masses are noted. No pleural effusions. No acute consolidative airspace disease. Mild linear scarring in the lower lobes of the lungs bilaterally is unchanged.  Upper Abdomen: Unremarkable.  Musculoskeletal/Soft Tissues: There are no aggressive appearing lytic or blastic lesions noted in the visualized portions of the skeleton. Old compression fractures at T3 and T4 are unchanged, most severe at T4 where there are is approximately 40% loss of anterior vertebral body height.  IMPRESSION: 1. No findings to suggest recurrent thymoma. 2. All previously noted tiny pulmonary nodules are stable in size compared to prior examinations and at this point are considered benign with no further imaging followup recommended. Mild scarring in the anterior aspect of the left upper lobe and in the lower lobes bilaterally is also unchanged and considered  benign. 3. Atherosclerosis, including left main and 2 vessel coronary artery disease. Please note that although the presence of coronary artery calcium documents the presence of coronary artery disease, the severity of this disease and any potential stenosis cannot be assessed on this non-gated CT examination. Assessment for potential risk factor modification, dietary therapy or pharmacologic therapy may be warranted, if clinically indicated.   Electronically Signed  By: Vinnie Langton M.D.  On: 08/20/2014 16:52  I have independently reviewed the above radiology studies  and reviewed the findings with the patient.   [name] Contrast  09/05/2013   CLINICAL DATA:  Evaluate pancreatic mass identified on CT. Prior history of anterior mediastinal mass is  EXAM: MRI ABDOMEN WITHOUT AND WITH CONTRAST  TECHNIQUE: Multiplanar multisequence MR imaging of the abdomen was performed both before and after the administration of intravenous contrast.  CONTRAST:  79mL MULTIHANCE GADOBENATE DIMEGLUMINE 529 MG/ML IV SOLN  COMPARISON:  NM PET IMAGE INITIAL (PI) SKULL BASE TO THIGH dated 08/29/2012; CT CHEST W/O CM dated 08/20/2013  FINDINGS: Pancreas: There is multilobular cystic change involving the body and tail of the pancreas. This appears to be extensive side branch ductal ectasia. Several  of these dilated cystic lesions appear to communicate with the pancreatic duct best seen on image 21, series 8 and image 22 series 8. Lesion in the tail of the pancreas measures 22 x 20 mm (image 22, series 8). Lesion along the body measures 14 x 14 mm. There are multiple smaller ectatic cystic lesions. The pancreatic duct itself is prominent but within normal limits at 3 mm. There is a smaller a 9 mm lesion in the uncinate process. A post-contrast enhanced T1 weighted images demonstrate no clear enhancement of these lesions.  Mild atelectasis at the right lung base. No focal hepatic lesion. No biliary duct  dilatation. The gallbladder is normal. Common bile duct is normal.  The spleen, adrenal glands, and kidneys normal. Stomach and limited view bowel unremarkable. No aggressive osseous lesion.  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 Differential would also include chronic cystic change of pancreatitis. One small lesion is the uncinate process. Lesion may be accessible for EUS sampling. Recommend GI / surgical consultation.   Electronically Signed   By: Suzy Bouchard M.D.   On: 09/05/2013 08:19     Recent Labs: Lab Results  Component Value Date   WBC 10.3 09/05/2012   HGB 13.2 09/05/2012   HCT 37.9* 09/05/2012   PLT 191 09/05/2012   GLUCOSE 108* 09/05/2012   CHOL 138 02/06/2013   TRIG 67.0 02/06/2013   HDL 53.60 02/06/2013   LDLCALC 71 02/06/2013   ALT 21 02/06/2013   AST 19 02/06/2013   NA 138 09/05/2012   K 4.2 09/05/2012   CL 105 09/05/2012   CREATININE 1.21 09/05/2012   BUN 13 09/05/2012   CO2 27 09/05/2012   INR 0.95 09/02/2012   PATH: Mediastinum, mass resection, Anterior - THYMOMA, 6.4 CM - SEE COMMENT. Microscopic Comment The specimen reveals an encapsulated well circumscribed mass. Histologic evaluation reveals a mixed population of mature appearing lymphocytes and macrophages. Immunohistochemical stains for CD3, CD79a, CD43, CD20 and CD5, reveal that the vast majority of the cells are T cells.   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  2. All previously noted tiny pulmonary nodules are stable in size compared to prior examinations and at this point are considered benign with no  further imaging followup recommended.   3. Atherosclerosis, including left main and 2 vessel coronary artery Disease noted on ct scan- no cardiac symptoms currently on statin and sees cardiology  Now stable 2 years postoperatively I've not made the patient a return appointment to see me but would be glad to see him in his or Dr. Golden Pop request  Grace Isaac 10/08/2014 10:35 AM

## 2014-10-14 DIAGNOSIS — Z79899 Other long term (current) drug therapy: Secondary | ICD-10-CM | POA: Diagnosis not present

## 2014-10-14 DIAGNOSIS — K862 Cyst of pancreas: Secondary | ICD-10-CM | POA: Diagnosis not present

## 2014-10-27 ENCOUNTER — Ambulatory Visit (AMBULATORY_SURGERY_CENTER): Payer: Self-pay | Admitting: *Deleted

## 2014-10-27 VITALS — Ht 70.0 in | Wt 196.0 lb

## 2014-10-27 DIAGNOSIS — Z86010 Personal history of colonic polyps: Secondary | ICD-10-CM

## 2014-10-27 MED ORDER — MOVIPREP 100 G PO SOLR: 1.0000 | Freq: Once | ORAL | Status: DC

## 2014-10-27 NOTE — Progress Notes (Signed)
No egg or soy allergy No home 02 use No diet pills No issues with past sedation No emmi video per pt. Pt states paying for movi prep is okay instead of doing miralax.

## 2014-11-03 ENCOUNTER — Other Ambulatory Visit (INDEPENDENT_AMBULATORY_CARE_PROVIDER_SITE_OTHER): Payer: Medicare Other | Admitting: *Deleted

## 2014-11-03 ENCOUNTER — Encounter: Payer: Self-pay | Admitting: Cardiology

## 2014-11-03 DIAGNOSIS — E785 Hyperlipidemia, unspecified: Secondary | ICD-10-CM

## 2014-11-03 LAB — HEPATIC FUNCTION PANEL
ALBUMIN: 3.6 g/dL (ref 3.5–5.2)
ALT: 17 U/L (ref 0–53)
AST: 19 U/L (ref 0–37)
Alkaline Phosphatase: 56 U/L (ref 39–117)
Bilirubin, Direct: 0.2 mg/dL (ref 0.0–0.3)
Total Bilirubin: 0.7 mg/dL (ref 0.2–1.2)
Total Protein: 6.6 g/dL (ref 6.0–8.3)

## 2014-11-03 LAB — LIPID PANEL
CHOL/HDL RATIO: 3
Cholesterol: 153 mg/dL (ref 0–200)
HDL: 58.7 mg/dL (ref >39.00)
LDL CALC: 78 mg/dL (ref 0–99)
NonHDL: 94.3
TRIGLYCERIDES: 81 mg/dL (ref 0.0–149.0)
VLDL: 16.2 mg/dL (ref 0.0–40.0)

## 2014-11-04 ENCOUNTER — Encounter: Payer: Self-pay | Admitting: *Deleted

## 2014-11-04 ENCOUNTER — Other Ambulatory Visit: Payer: Self-pay | Admitting: *Deleted

## 2014-11-04 MED ORDER — ROSUVASTATIN CALCIUM 10 MG PO TABS: 10.0000 mg | ORAL_TABLET | Freq: Every day | ORAL | Status: DC

## 2014-11-06 NOTE — Telephone Encounter (Signed)
Samples for incruse never picked up and placed back on shelf

## 2014-11-10 ENCOUNTER — Encounter: Payer: Self-pay | Admitting: Internal Medicine

## 2014-11-10 ENCOUNTER — Ambulatory Visit (AMBULATORY_SURGERY_CENTER): Payer: Medicare Other | Admitting: Internal Medicine

## 2014-11-10 ENCOUNTER — Other Ambulatory Visit: Payer: Self-pay | Admitting: Internal Medicine

## 2014-11-10 VITALS — BP 104/64 | HR 57 | Temp 95.7°F | Resp 17 | Ht 70.0 in | Wt 196.0 lb

## 2014-11-10 DIAGNOSIS — Z86010 Personal history of colonic polyps: Secondary | ICD-10-CM | POA: Diagnosis not present

## 2014-11-10 DIAGNOSIS — Z1211 Encounter for screening for malignant neoplasm of colon: Secondary | ICD-10-CM | POA: Diagnosis not present

## 2014-11-10 DIAGNOSIS — D123 Benign neoplasm of transverse colon: Secondary | ICD-10-CM | POA: Diagnosis not present

## 2014-11-10 DIAGNOSIS — I10 Essential (primary) hypertension: Secondary | ICD-10-CM | POA: Diagnosis not present

## 2014-11-10 DIAGNOSIS — J449 Chronic obstructive pulmonary disease, unspecified: Secondary | ICD-10-CM | POA: Diagnosis not present

## 2014-11-10 MED ORDER — SODIUM CHLORIDE 0.9 % IV SOLN: 500.0000 mL | INTRAVENOUS | Status: DC

## 2014-11-10 NOTE — Progress Notes (Signed)
Called to room to assist during endoscopic procedure.  Patient ID and intended procedure confirmed with present staff. Received instructions for my participation in the procedure from the performing physician.  

## 2014-11-10 NOTE — Progress Notes (Signed)
Patient awakening,vss,report to rn 

## 2014-11-10 NOTE — Patient Instructions (Signed)
Impressions/recommendations:  Polyps (handout given) Diverticulosis (handout given) High fiber diet (handout given)  Repeat colonoscopy in 5 years.  YOU HAD AN ENDOSCOPIC PROCEDURE TODAY AT [facility]:   Refer to the procedure report that was given to you for any specific questions about what was found during the examination.  If the procedure report does not answer your questions, please call your gastroenterologist to clarify.  If you requested that your care partner not be given the details of your procedure findings, then the procedure report has been included in a sealed envelope for you to review at your convenience later.  YOU SHOULD EXPECT: Some feelings of bloating in the abdomen. Passage of more gas than usual.  Walking can help get rid of the air that was put into your GI tract during the procedure and reduce the bloating. If you had a lower endoscopy (such as a colonoscopy or flexible sigmoidoscopy) you may notice spotting of blood in your stool or on the toilet paper. If you underwent a bowel prep for your procedure, you may not have a normal bowel movement for a few days.  Please Note:  You might notice some irritation and congestion in your nose or some drainage.  This is from the oxygen used during your procedure.  There is no need for concern and it should clear up in a day or so.  SYMPTOMS TO REPORT IMMEDIATELY:   Following lower endoscopy (colonoscopy or flexible sigmoidoscopy):  Excessive amounts of blood in the stool  Significant tenderness or worsening of abdominal pains  Swelling of the abdomen that is new, acute  Fever of 100F or higher   For urgent or emergent issues, a gastroenterologist can be reached at any hour by calling [phone].   DIET: Your first meal following the procedure should be a small meal and then it is ok to progress to your normal diet. Heavy or fried foods are harder to digest and may make you feel nauseous or bloated.   Likewise, meals heavy in dairy and vegetables can increase bloating.  Drink plenty of fluids but you should avoid alcoholic beverages for 24 hours.  ACTIVITY:  You should plan to take it easy for the rest of today and you should NOT DRIVE or use heavy machinery until tomorrow (because of the sedation medicines used during the test).    FOLLOW UP: Our staff will call the number listed on your records the next business day following your procedure to check on you and address any questions or concerns that you may have regarding the information given to you following your procedure. If we do not reach you, we will leave a message.  However, if you are feeling well and you are not experiencing any problems, there is no need to return our call.  We will assume that you have returned to your regular daily activities without incident.  If any biopsies were taken you will be contacted by phone or by letter within the next 1-3 weeks.  Please call us at [phone] if you have not heard about the biopsies in 3 weeks.    SIGNATURES/CONFIDENTIALITY: You and/or your care partner have signed paperwork which will be entered into your electronic medical record.  These signatures attest to the fact that that the information above on your After Visit Summary has been reviewed and is understood.  Full responsibility of the confidentiality of this discharge information lies with you and/or your care-partner.

## 2014-11-10 NOTE — Op Note (Signed)
[facility]  [address]. [place], [id number]   COLONOSCOPY PROCEDURE REPORT  PATIENT: Michael [name], Michael [name]  MR#: [id number] BIRTHDATE: 1941/03/08 , 73  yrs. old GENDER: male ENDOSCOPIST: Eustace Quail, MD REFERRED QB:[id number] Program Recall PROCEDURE DATE:  11/10/2014 PROCEDURE:   Colonoscopy, surveillance and Colonoscopy with snare polypectomy x 3 First Screening Colonoscopy - Avg.  risk and is 50 yrs.  old or older - No.  Prior Negative Screening - Now for repeat screening. N/A  History of Adenoma - Now for follow-up colonoscopy & has been > or = to 3 yrs.  Yes hx of adenoma.  Has been 3 or more years since last colonoscopy.  Polyps Removed Today ASA CLASS:   Class II INDICATIONS:Surveillance due to prior colonic neoplasia and PH Colon Adenoma.   Index ([place]) 2007 (TVA,TA); f/u 2011 MEDICATIONS: Monitored anesthesia care and Propofol 250 mg IV  .Marland Kitchen  DESCRIPTION OF PROCEDURE:   After the risks benefits and alternatives of the procedure were thoroughly explained, informed consent was obtained.  The digital rectal exam revealed no abnormalities of the rectum.   The LB [id number]  endoscope was introduced through the anus and advanced to the cecum, which was identified by both the appendix and ileocecal valve. No adverse events experienced.   The quality of the prep was excellent. (MoviPrep was used)  The instrument was then slowly withdrawn as the colon was fully examined.   COLON FINDINGS: Three polyps measuring 3 mm in size were found in the transverse colon.  A polypectomy was performed with a cold snare.  The resection was complete, the polyp tissue was completely retrieved and sent to histology.   There was moderate diverticulosis noted in the left colon and right colon.   The examination was otherwise normal.  Retroflexed views revealed internal hemorrhoids. The time to cecum = 1.4 Withdrawal time = 11.9   The scope was withdrawn and the procedure  completed. COMPLICATIONS: There were no immediate complications.  ENDOSCOPIC IMPRESSION: 1.   Three polyps were found in the transverse colon; polypectomy was performed with a cold snare 2.   Moderate diverticulosis was noted in the left colon and right colon 3.   The examination was otherwise normal  RECOMMENDATIONS: 1. Follow up colonoscopy in 5 years  eSigned:  Eustace Quail, MD 11/10/2014 9:31 AM   cc: Leanna Battles, MD and The Patient

## 2014-11-11 ENCOUNTER — Telehealth: Payer: Self-pay | Admitting: *Deleted

## 2014-11-11 NOTE — Telephone Encounter (Signed)
  Follow up Call-  Call back number 11/10/2014  Post procedure Call Back phone  # [phone]  Permission to leave phone message Yes     Patient questions:  Do you have a fever, pain , or abdominal swelling? No. Pain Score  0 *  Have you tolerated food without any problems? Yes.    Have you been able to return to your normal activities? Yes.    Do you have any questions about your discharge instructions: Diet   No. Medications  No. Follow up visit  No.  Do you have questions or concerns about your Care? No.  Actions: * If pain score is 4 or above: No action needed, pain <4.

## 2014-11-16 ENCOUNTER — Telehealth: Payer: Self-pay | Admitting: Internal Medicine

## 2014-11-16 NOTE — Telephone Encounter (Signed)
Discussed with pt that the results are not back yet and we will call him when they are ready. Pt knows he will receive a letter also.

## 2014-11-17 ENCOUNTER — Encounter: Payer: Self-pay | Admitting: Internal Medicine

## 2015-01-31 ENCOUNTER — Encounter: Payer: Self-pay | Admitting: Cardiology

## 2015-02-18 DIAGNOSIS — L300 Nummular dermatitis: Secondary | ICD-10-CM | POA: Diagnosis not present

## 2015-02-18 DIAGNOSIS — D485 Neoplasm of uncertain behavior of skin: Secondary | ICD-10-CM | POA: Diagnosis not present

## 2015-02-18 DIAGNOSIS — L570 Actinic keratosis: Secondary | ICD-10-CM | POA: Diagnosis not present

## 2015-02-18 DIAGNOSIS — D1801 Hemangioma of skin and subcutaneous tissue: Secondary | ICD-10-CM | POA: Diagnosis not present

## 2015-02-18 DIAGNOSIS — L814 Other melanin hyperpigmentation: Secondary | ICD-10-CM | POA: Diagnosis not present

## 2015-02-18 DIAGNOSIS — C44729 Squamous cell carcinoma of skin of left lower limb, including hip: Secondary | ICD-10-CM | POA: Diagnosis not present

## 2015-02-18 DIAGNOSIS — L821 Other seborrheic keratosis: Secondary | ICD-10-CM | POA: Diagnosis not present

## 2015-03-02 DIAGNOSIS — Z23 Encounter for immunization: Secondary | ICD-10-CM | POA: Diagnosis not present

## 2015-03-03 ENCOUNTER — Encounter: Payer: Self-pay | Admitting: Internal Medicine

## 2015-03-03 ENCOUNTER — Ambulatory Visit (INDEPENDENT_AMBULATORY_CARE_PROVIDER_SITE_OTHER): Payer: Medicare Other | Admitting: Internal Medicine

## 2015-03-03 VITALS — BP 138/84 | HR 67 | Ht 70.0 in | Wt 192.0 lb

## 2015-03-03 DIAGNOSIS — R911 Solitary pulmonary nodule: Secondary | ICD-10-CM | POA: Diagnosis not present

## 2015-03-03 DIAGNOSIS — R682 Dry mouth, unspecified: Secondary | ICD-10-CM

## 2015-03-03 DIAGNOSIS — J438 Other emphysema: Secondary | ICD-10-CM | POA: Diagnosis not present

## 2015-03-03 DIAGNOSIS — R0982 Postnasal drip: Secondary | ICD-10-CM

## 2015-03-03 MED ORDER — FLUTICASONE PROPIONATE 50 MCG/ACT NA SUSP: 2.0000 | Freq: Every day | NASAL | Status: DC

## 2015-03-03 NOTE — Progress Notes (Signed)
Subjective:    Patient ID: Michael [name], male    DOB: 06-18-1941, 74 y.o.   MRN: [id number]  HPI    #Thymoma stage I  - Diagnosed during low-dose CT scan of the chest screening program for lung cancer - Status post complete resection without residual disease September 03, 2012 - Followup Dr Servando Snare  #COPD mild, Gold stage I versus mild asthma - COPD cat score 4 on 10/08/2012  - Pulmonary function test done February 2014: Prebronchodilator FEV1 while on Spiriva 2.4 L/76%. Postbronchodilator FEV1 2.6 L/82% with FEV1 FEC ratio of 69 prebronchodilator and 76 post bronchodilator. Diffusion capacity 90%   #Pulmonary nodule - 3 mm lung nodule right upper lobe and left upper lobe diagnosed February 2014 during CT scan chest screening program for lung cancer   OV 10/08/2012  Followup for all of the above. He is now status post resection of thymoma which is now stage I. Looks like she will have annual surveillance CT scan of the chest with Dr. Servando Snare He is doing well. In retrospect he recollects retrosternal pain that is now completely resolved after surgery. He also does have some fatigue which is better. He denies any myasthenia symptoms. He continues with Spiriva and he finds good relief. I reviewed his pulmonary function test and is more suggestive of asthma because of normal diffusion capacity but he is content taking Spiriva and does not want to switch.  Currently he is putting his life back together and wants to focus on fitness by joining the [facility]  There are no new issues   OV 07/31/2013  Chief Complaint  Patient presents with  . Follow-up    Pt states he feels SOB is improved since last OV. Pt c/o having some PND, and dry mouth.     Followup for  - Emphysema mild: Currently stable. He is using Spiriva which he likes except for the fact that it might be associated with dry mouth [see below]. COPD cat score is 10 and reflects mild symptom burden.   - Lung nodule: He is due  for his serial CT scan of the chest in mid February 2015. This will mark 1 year followup  - Thymoma: Stage I. Status post resection. He is due for his one-year CT scan of the chest but February 2015 and this needs to be arranged  -- New complaint: Started Spiriva 8-9 months ago. However for the last 4 months even in the fall in the summer started noticing severe dry mouth especially at night. This is not associated with dry eyes. It is there daily. It is not progressive. It is relieved by drinking water. It is associated with Spiriva intake. His nose is her autoimmune symptoms or any other associated symptoms. This no radiation s    REC #Dry mouth   - stop spiriva for 3 weeks   #Mild emphysema  - currently stable  - due to above issue, stop spiriva.   - use albuterol 2 puff as needed; take script 0- if you get worse, call us  #Lung nodule and thymoma followup  - do CT scan chest with contrast mid feb 2015  #Followup  3weeks to assess followup of above issues  OV 08/22/2013  Chief Complaint  Patient presents with  . Follow-up    to review CT results. Pt states cough and sob has improved with spiriva.     Fu several issues  - dry mouth: did not improve after dc spiriva. So went back on  spiriva. Advised biotene mouth wash and he is better. He is due to see dentist.   - emphysema: got worse after he stopped spiriva but improved and back to baseline after he went back on spiriva  - lung nodule and thymoma; had 1 year CT chest that shows no recurence and nodule stability  - New issue: CT chest feb 2015 shows possible mass lesion tail of pancreas and more advnaced imaging recommended. He prefers I d/w Dr Henrene Pastor his GI first before ordering advanced imaging   Ct Chest Wo Contrast  08/20/2013   CLINICAL DATA:  Routine followup lung nodule  EXAM: CT CHEST WITHOUT CONTRAST  TECHNIQUE: Multidetector CT imaging of the chest was performed following the standard protocol without IV  contrast.  COMPARISON:  08/19/2012  FINDINGS: No pleural effusion identified. There is no airspace consolidation. Scarring noted within the posterior lung bases. Scar is identified within the left upper lobe, image 19/series 3 stable tiny left upper lobe nodule measuring 3 mm, image 18/series 3. Right upper lobe nodule measures 3 mm and is unchanged from previous exam, image 22/series 3. No new or enlarging pulmonary nodules or masses. 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 No evidence for recurrence of tumor. Calcified atherosclerotic change involves the thoracic aorta. There is no aneurysm. There is also a calcification within the LAD coronary artery. No mediastinal or hilar adenopathy noted. There is no axillary or supraclavicular adenopathy.  Incidental imaging through the upper abdomen shows no acute findings. However, there is a change in the appearance of the distal tail of pancreas. An area of soft tissue fullness and mild peripheral and distinctness measures 2.5 cm, image 57/series 2.  IMPRESSION: 1. Stable small pulmonary nodules which are likely benign. 2. Status post resection of anterior mediastinal mass without evidence for recurrence of tumor. 3. Interval change in the appearance of the distal tail of pancreas which now exhibits abnormal soft tissue fullness and mild peripheral indistinctness. Underlying pancreatic mass cannot be excluded on this noncontrast CT through the upper abdomen. Recommend further evaluation with contrast enhanced CT or MRI of the pancreas. These results will be called to the ordering clinician or representative by the Radiologist Assistant, and communication documented in the PACS Dashboard. .   Electronically Signed   By: Kerby Moors M.D.   On: 08/20/2013 15:59    OV 06/05/2014  Chief Complaint  Patient presents with  . Follow-up    Pt c/o of recent chest cold but resolved with abx that was given for a dental procedure. Pt denies change in  SOB and c/o prod cough with clear mucus. Pt denies CP/tightness.      Fu several issues  - dry mouth: did not improve after dc spiriva. So went back on spiriva.Still persists, mild. Has learned to live wit hit  - emphysema: got worse after he stopped spiriva but improved and back to baseline after he went back on spiriva. He says uptodate with vaccines. Wants 1 year refill on spiriva starting jan 2016  - Pst nasal drip: mild but more active in winter  - lung nodule and thymoma; had 1 year CT chest that shows no recurence and nodule stability in feb 2015. 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 He is now fu at [facility] with Q6 month MRI. Not malignant apparently  OV 03/03/2015  Chief Complaint  Patient presents with  .  Follow-up    Pt states his breathing is unchanged since last OV. Pt c/o PND and mild intermittent cough with little mucus production. Pt significant SOB, CP/tightness.    Follow-up several issues  - Emphysema: He continues on Spiriva 1 puff daily. Without this his symptoms has gotten worse in the past. He's had his flu shot. COPD cat score is less than 10 and shows stable and minimal symptom burden  Postnasal drip: Nasal steroidal Ciclesonide helped him a little bit but then he stopped using it. He wants a cheaper alternative. I gave him samples of Ciclesonide. He will get generic fluticasone  - Thymoma: Last CT scan of the chest was interpreted 2016 and showed this was a benign thymoma in complete remission. Dr. Pia Mau the surgeon is no longer following him. I will check with Dr. Pia Mau as to when the next CT scan should be if at all  - Dry mouth: This continues to plague him. He is frustrated but has learned to live with it. Of note is on ACE inhibitor which is not known to cause dry mouth    CAT COPD Symptom & Quality of Life Score (GSK trademark) 0 is no burden. 5 is  highest burden 03/03/2015   Never Cough -> Cough all the time 1  No phlegm in chest -> Chest is full of phlegm 1  No chest tightness -> Chest feels very tight 1  No dyspnea for 1 flight stairs/hill -> Very dyspneic for 1 flight of stairs 2  No limitations for ADL at home -> Very limited with ADL at home 1  2Confident leaving home -> Not at all confident leaving home 1  2Sleep soundly -> Do not sleep soundly because of lung condition 2  Lots of Energy -> No energy at all 2  TOTAL Score (max 40)  10       Current outpatient prescriptions:  .  ALPRAZolam (XANAX) 0.25 MG tablet, Take 0.25 mg by mouth daily as needed. For anxiety, Disp: , Rfl:  .  amLODipine (NORVASC) 5 MG tablet, Take 1 and 1/2 tablets  (7.5mg ) by mouth daily, Disp: 135 tablet, Rfl: 3 .  aspirin EC 81 MG tablet, Take 81 mg by mouth every evening., Disp: , Rfl:  .  atorvastatin (LIPITOR) 40 MG tablet, Take 40 mg by mouth daily., Disp: , Rfl:  .  Calcium Carbonate-Vitamin D (CALCIUM + D PO), Take 1 tablet by mouth daily., Disp: , Rfl:  .  ciclesonide (OMNARIS) 50 MCG/ACT nasal spray, Place 2 sprays into both nostrils daily., Disp: , Rfl:  .  Coenzyme Q10 (CO Q 10) 100 MG CAPS, Take 200 mg by mouth daily., Disp: , Rfl:  .  ibuprofen (ADVIL,MOTRIN) 200 MG tablet, Take 400 mg by mouth every 8 (eight) hours as needed for moderate pain. , Disp: , Rfl:  .  lisinopril (PRINIVIL,ZESTRIL) 10 MG tablet, Take 1 tablet by mouth  daily, Disp: 90 tablet, Rfl: 3 .  Probiotic Product (PROBIOTIC DAILY PO), Take by mouth daily., Disp: , Rfl:  .  rosuvastatin (CRESTOR) 10 MG tablet, Take 1 tablet (10 mg total) by mouth daily., Disp: 90 tablet, Rfl: 3 .  tiotropium (SPIRIVA HANDIHALER) 18 MCG inhalation capsule, Inhale the contents of 1  capsule via HandiHaler  Daily. Pt needs to call and schedule an appointment for more refills., Disp: 90 capsule, Rfl: 0   Review of Systems  Constitutional: Negative for fever and unexpected weight change.    HENT: Negative for congestion,  dental problem, ear pain, nosebleeds, postnasal drip, rhinorrhea, sinus pressure, sneezing, sore throat and trouble swallowing.   Eyes: Negative for redness and itching.  Respiratory: Positive for cough. Negative for chest tightness, shortness of breath and wheezing.   Cardiovascular: Negative for palpitations and leg swelling.  Gastrointestinal: Negative for nausea and vomiting.  Genitourinary: Negative for dysuria.  Musculoskeletal: Negative for joint swelling.  Skin: Negative for rash.  Neurological: Negative for headaches.  Hematological: Does not bruise/bleed easily.  Psychiatric/Behavioral: Negative for dysphoric mood. The patient is not nervous/anxious.        Objective:   Physical Exam  Constitutional: He is oriented to person, place, and time. He appears well-developed and well-nourished. No distress.  HENT:  Head: Normocephalic and atraumatic.  Right Ear: External ear normal.  Left Ear: External ear normal.  Mouth/Throat: Oropharynx is clear and moist. No oropharyngeal exudate.  Eyes: Conjunctivae and EOM are normal. Pupils are equal, round, and reactive to light. Right eye exhibits no discharge. Left eye exhibits no discharge. No scleral icterus.  Neck: Normal range of motion. Neck supple. No JVD present. No tracheal deviation present. No thyromegaly present.  Cardiovascular: Normal rate, regular rhythm and intact distal pulses.  Exam reveals no gallop and no friction rub.   No murmur heard. Pulmonary/Chest: Effort normal and breath sounds normal. No respiratory distress. He has no wheezes. He has no rales. He exhibits no tenderness.  Abdominal: Soft. Bowel sounds are normal. He exhibits no distension and no mass. There is no tenderness. There is no rebound and no guarding.  Visceral obesity present  Musculoskeletal: Normal range of motion. He exhibits no edema or tenderness.  Lymphadenopathy:    He has no cervical adenopathy.  Neurological:  He is alert and oriented to person, place, and time. He has normal reflexes. No cranial nerve deficit. Coordination normal.  Skin: Skin is warm and dry. No rash noted. He is not diaphoretic. No erythema. No pallor.  Psychiatric: He has a normal mood and affect. His behavior is normal. Judgment and thought content normal.  Nursing note and vitals reviewed.   Filed Vitals:   03/03/15 1414  BP: 138/84  Pulse: 67  Height: 5\' 10"  (1.778 m)  Weight: 192 lb (87.091 kg)  SpO2: 98%  ;ls      Assessment & Plan:     ICD-9-CM ICD-10-CM   1. Other emphysema 492.8 J43.8   2. Lung nodule 793.11 R91.1   3. Post-nasal drip 784.91 R09.82   4. Dry mouth 527.7 R68.2    #Mild emphysema  - currently stable  - continue  spiriva.   - use albuterol 2 puff as needed; take script - if you get worse, call us - Glad you  had flu shot  #Post nasal drip  -  take generic fluticasone inhaler 2 squirts each nostril daily    #Lung nodule and thymoma followup  - no recurrence  CT scan chest with contrast mid feb 2015 and feb 2016 - I will discuss withl DR Servando Snare  about need for  followup for the scan  #Dry mouth  - Try daily flaxseed - You can stop lisinopril for few weeks and see if this makes a difference but monitor blood pressure when you do that  #Followup  -  - otherwise, 9 months for emphysema   Dr. Brand Males, M.D., [facility].C.P Pulmonary and Critical Care Medicine Staff Physician [place] [facility] Pager: [phone], If no answer or between  15:00h - 7:00h: call [phone]  03/12/2015 7:29 AM

## 2015-03-03 NOTE — Patient Instructions (Addendum)
ICD-9-CM ICD-10-CM   1. Other emphysema 492.8 J43.8   2. Lung nodule 793.11 R91.1   3. Post-nasal drip 784.91 R09.82   4. Dry mouth 527.7 R68.2   \ #Mild emphysema  - currently stable  - continue  spiriva.   - use albuterol 2 puff as needed; take script - if you get worse, call us - Glad you  had flu shot  #Post nasal drip  -  take generic fluticasone inhaler 2 squirts each nostril daily    #Lung nodule and thymoma followup  - no recurrence  CT scan chest with contrast mid feb 2015 and feb 2016 - I will discuss withl DR Servando Snare  about need for  followup for the scan  #Dry mouth  - Try daily flaxseed - You can stop lisinopril for few weeks and see if this makes a difference but monitor blood pressure when you do that  #Followup  -  - otherwise, 9 months for emphysema

## 2015-03-12 ENCOUNTER — Telehealth: Payer: Self-pay | Admitting: Internal Medicine

## 2015-03-12 NOTE — Telephone Encounter (Signed)
Hi [name]   This is a gentleman Michael [name] that you operated for thymoma benign Stage 1 I believe. What is your surveillance rec?   Thanks  Dr. Brand Males, M.D., [facility].C.P Pulmonary and Critical Care Medicine Staff Physician [place] [facility] Pager: [phone], If no answer or between  15:00h - 7:00h: call [phone]  03/12/2015 7:30 AM

## 2015-04-01 DIAGNOSIS — C44729 Squamous cell carcinoma of skin of left lower limb, including hip: Secondary | ICD-10-CM | POA: Diagnosis not present

## 2015-04-01 DIAGNOSIS — L308 Other specified dermatitis: Secondary | ICD-10-CM | POA: Diagnosis not present

## 2015-04-01 DIAGNOSIS — L309 Dermatitis, unspecified: Secondary | ICD-10-CM | POA: Diagnosis not present

## 2015-04-13 DIAGNOSIS — Z4802 Encounter for removal of sutures: Secondary | ICD-10-CM | POA: Diagnosis not present

## 2015-04-14 DIAGNOSIS — Z79899 Other long term (current) drug therapy: Secondary | ICD-10-CM | POA: Diagnosis not present

## 2015-04-14 DIAGNOSIS — K862 Cyst of pancreas: Secondary | ICD-10-CM | POA: Diagnosis not present

## 2015-04-14 DIAGNOSIS — D490 Neoplasm of unspecified behavior of digestive system: Secondary | ICD-10-CM | POA: Diagnosis not present

## 2015-06-20 ENCOUNTER — Encounter: Payer: Self-pay | Admitting: Internal Medicine

## 2015-06-21 ENCOUNTER — Telehealth: Payer: Self-pay | Admitting: *Deleted

## 2015-06-21 MED ORDER — AZITHROMYCIN 250 MG PO TABS: ORAL_TABLET | ORAL | Status: DC

## 2015-06-21 MED ORDER — PREDNISONE 10 MG PO TABS: ORAL_TABLET | ORAL | Status: DC

## 2015-06-21 NOTE — Telephone Encounter (Signed)
My Chart Message:  Dr. Chase Caller has asked me to immediately contact him when I get a cold or other respiratory problem as apparently my COPD requires immediate attention to such situations. I am presently suffering from runny nose, aches and pains all over, headaches focused on my sinuses, and fairly severe coughing accompanied by pain in my chest when I cough. These symptoms have all appeared in the last 24 hours while I was on vacation in [place]. I have requested a refill of a generic flownaze (SP?) nasal spray that Dr. Alfonso Patten has prescribed before, and am using Advil for pain, but should I be doing or taking anything else? 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  Dr. Chase Caller, please advise.

## 2015-06-21 NOTE — Telephone Encounter (Signed)
Called spoke with spouse (pt was not at home) and discussed MR's recs.  Spouse voiced her understanding and denied any questions/concerns.  She is aware to contact the office if pt's symptoms do not improve or they worsen.  Rx's sent to verified pharmacy Nothing further needed; will sign off.

## 2015-06-21 NOTE — Telephone Encounter (Signed)
Spoke to patient - he said sputum is green. Wheezing a bit more than usual. Maybe more dyspneic. No fever but has had chills. Has 2 services coming up  He was coughing as I spoke to him  A AECOPD  Plan - monitor ace inhibtor use - Z - PAK - Please take prednisone 40 mg x1 day, then 30 mg x1 day, then 20 mg x1 day, then 10 mg x1 day, and then 5 mg x1 day and stop  - Call [facility] [address] [place]      Current outpatient prescriptions:  .  ALPRAZolam (XANAX) 0.25 MG tablet, Take 0.25 mg by mouth daily as needed. For anxiety, Disp: , Rfl:  .  amLODipine (NORVASC) 5 MG tablet, Take 1 and 1/2 tablets  (7.5mg ) by mouth daily, Disp: 135 tablet, Rfl: 3 .  aspirin EC 81 MG tablet, Take 81 mg by mouth every evening., Disp: , Rfl:  .  atorvastatin (LIPITOR) 40 MG tablet, Take 40 mg by mouth daily., Disp: , Rfl:  .  Calcium Carbonate-Vitamin D (CALCIUM + D PO), Take 1 tablet by mouth daily., Disp: , Rfl:  .  ciclesonide (OMNARIS) 50 MCG/ACT nasal spray, Place 2 sprays into both nostrils daily., Disp: , Rfl:  .  Coenzyme Q10 (CO Q 10) 100 MG CAPS, Take 200 mg by mouth daily., Disp: , Rfl:  .  fluticasone (FLONASE) 50 MCG/ACT nasal spray, Place 2 sprays into both nostrils daily., Disp: 16 g, Rfl: 5 .  ibuprofen (ADVIL,MOTRIN) 200 MG tablet, Take 400 mg by mouth every 8 (eight) hours as needed for moderate pain. , Disp: , Rfl:  .  lisinopril (PRINIVIL,ZESTRIL) 10 MG tablet, Take 1 tablet by mouth  daily, Disp: 90 tablet, Rfl: 3 .  Probiotic Product (PROBIOTIC DAILY PO), Take by mouth daily., Disp: , Rfl:  .  rosuvastatin (CRESTOR) 10 MG tablet, Take 1 tablet (10 mg total) by mouth daily., Disp: 90 tablet, Rfl: 3 .  tiotropium (SPIRIVA HANDIHALER) 18 MCG inhalation capsule, Inhale the contents of 1  capsule via HandiHaler  Daily. Pt needs to call and schedule an appointment for more refills., Disp: 90 capsule, Rfl: 0

## 2015-06-25 ENCOUNTER — Telehealth: Payer: Self-pay | Admitting: Internal Medicine

## 2015-06-25 MED ORDER — PREDNISONE 10 MG PO TABS: ORAL_TABLET | ORAL | Status: DC

## 2015-06-25 MED ORDER — HYDROCODONE-HOMATROPINE 5-1.5 MG/5ML PO SYRP: 5.0000 mL | ORAL_SOLUTION | Freq: Two times a day (BID) | ORAL | Status: DC | PRN

## 2015-06-25 MED ORDER — LEVOFLOXACIN 500 MG PO TABS: 500.0000 mg | ORAL_TABLET | Freq: Every day | ORAL | Status: DC

## 2015-06-25 NOTE — Telephone Encounter (Signed)
Do levaquin 500mg  daily x 5 days Repeat Please take prednisone 40 mg x1 day, then 30 mg x1 day, then 20 mg x1 day, then 10 mg x1 day, and then 5 mg x1 day and stop  Do nyquil prn OTC Can give hycodan cough syrup 41mL bid x 5 days prn. No refill.   Let him know cough post viral can be tough to rx even with bedst of medications and many times only time can resolve it - can take weeks. If above does not work patience is best Rx though I understad that he has services to perform and what we are doing above is top gun

## 2015-06-25 NOTE — Telephone Encounter (Signed)
Called spoke with pt and is aware of recs below. RX's sent into the pharmacy. MW agree'd to sign hycodan RX and this placed for pick up. Nothing further needed

## 2015-06-25 NOTE — Telephone Encounter (Signed)
From Mychart:  Sorry to keep bothering you, but while I think I'm making progress on feeling better, the coughing continues to plague me. It is almost continuous, and severe enough to cause headaches and strained chest muscles. 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 Coughing all over the bread and wine as I consecrate it at the altar is not going to go over well with the congregation! Can you ask Dr. Alfonso Patten. to prescribe something to at least get me through the weekend services? Nothing I have tried so far seems to work, including nasal sprays, and hot liquids. Other than cough related pain, the other aches and pains seem to be fading. Still getting some crap out of my throat on occasion, green to brown in color, with low grade sore throat and no fever. Thanks for your help! Michael [name]  Dr. Chase Caller, please advise.

## 2015-07-30 DIAGNOSIS — I10 Essential (primary) hypertension: Secondary | ICD-10-CM | POA: Diagnosis not present

## 2015-07-30 DIAGNOSIS — N390 Urinary tract infection, site not specified: Secondary | ICD-10-CM | POA: Diagnosis not present

## 2015-07-30 DIAGNOSIS — R8299 Other abnormal findings in urine: Secondary | ICD-10-CM | POA: Diagnosis not present

## 2015-07-30 DIAGNOSIS — E784 Other hyperlipidemia: Secondary | ICD-10-CM | POA: Diagnosis not present

## 2015-07-30 DIAGNOSIS — Z125 Encounter for screening for malignant neoplasm of prostate: Secondary | ICD-10-CM | POA: Diagnosis not present

## 2015-07-30 DIAGNOSIS — M859 Disorder of bone density and structure, unspecified: Secondary | ICD-10-CM | POA: Diagnosis not present

## 2015-08-03 DIAGNOSIS — Z1212 Encounter for screening for malignant neoplasm of rectum: Secondary | ICD-10-CM | POA: Diagnosis not present

## 2015-08-06 DIAGNOSIS — M859 Disorder of bone density and structure, unspecified: Secondary | ICD-10-CM | POA: Diagnosis not present

## 2015-08-06 DIAGNOSIS — Z1389 Encounter for screening for other disorder: Secondary | ICD-10-CM | POA: Diagnosis not present

## 2015-08-06 DIAGNOSIS — N183 Chronic kidney disease, stage 3 (moderate): Secondary | ICD-10-CM | POA: Diagnosis not present

## 2015-08-06 DIAGNOSIS — R3129 Other microscopic hematuria: Secondary | ICD-10-CM | POA: Diagnosis not present

## 2015-08-06 DIAGNOSIS — Z0000 Encounter for general adult medical examination without abnormal findings: Secondary | ICD-10-CM | POA: Diagnosis not present

## 2015-08-06 DIAGNOSIS — I10 Essential (primary) hypertension: Secondary | ICD-10-CM | POA: Diagnosis not present

## 2015-08-06 DIAGNOSIS — J449 Chronic obstructive pulmonary disease, unspecified: Secondary | ICD-10-CM | POA: Diagnosis not present

## 2015-08-06 DIAGNOSIS — Z6828 Body mass index (BMI) 28.0-28.9, adult: Secondary | ICD-10-CM | POA: Diagnosis not present

## 2015-08-06 DIAGNOSIS — E784 Other hyperlipidemia: Secondary | ICD-10-CM | POA: Diagnosis not present

## 2015-09-02 DIAGNOSIS — L570 Actinic keratosis: Secondary | ICD-10-CM | POA: Diagnosis not present

## 2015-09-02 DIAGNOSIS — I872 Venous insufficiency (chronic) (peripheral): Secondary | ICD-10-CM | POA: Diagnosis not present

## 2015-09-02 DIAGNOSIS — L308 Other specified dermatitis: Secondary | ICD-10-CM | POA: Diagnosis not present

## 2015-09-15 DIAGNOSIS — R3129 Other microscopic hematuria: Secondary | ICD-10-CM | POA: Diagnosis not present

## 2015-09-15 DIAGNOSIS — Z0000 Encounter for general adult medical examination without abnormal findings: Secondary | ICD-10-CM | POA: Diagnosis not present

## 2015-09-30 ENCOUNTER — Other Ambulatory Visit: Payer: Self-pay | Admitting: *Deleted

## 2015-09-30 ENCOUNTER — Telehealth: Payer: Self-pay | Admitting: Internal Medicine

## 2015-09-30 MED ORDER — AMLODIPINE BESYLATE 5 MG PO TABS: 5.0000 mg | ORAL_TABLET | Freq: Every day | ORAL | Status: DC

## 2015-09-30 MED ORDER — ROSUVASTATIN CALCIUM 10 MG PO TABS: 10.0000 mg | ORAL_TABLET | Freq: Every day | ORAL | Status: DC

## 2015-09-30 MED ORDER — TIOTROPIUM BROMIDE MONOHYDRATE 18 MCG IN CAPS: ORAL_CAPSULE | RESPIRATORY_TRACT | Status: DC

## 2015-09-30 MED ORDER — LISINOPRIL 10 MG PO TABS: 10.0000 mg | ORAL_TABLET | Freq: Every day | ORAL | Status: DC

## 2015-09-30 NOTE — Telephone Encounter (Signed)
Spiriva Handihaler rx sent to [facility].  Pt aware and voiced no further questions or concerns at this time.

## 2015-10-04 ENCOUNTER — Other Ambulatory Visit: Payer: Self-pay | Admitting: Cardiology

## 2015-10-04 ENCOUNTER — Other Ambulatory Visit: Payer: Self-pay | Admitting: Internal Medicine

## 2015-10-04 ENCOUNTER — Encounter: Payer: Self-pay | Admitting: Internal Medicine

## 2015-10-05 ENCOUNTER — Other Ambulatory Visit: Payer: Self-pay | Admitting: *Deleted

## 2015-10-05 MED ORDER — LISINOPRIL 10 MG PO TABS: 10.0000 mg | ORAL_TABLET | Freq: Every day | ORAL | Status: DC

## 2015-10-05 MED ORDER — AMLODIPINE BESYLATE 5 MG PO TABS
5.0000 mg | ORAL_TABLET | Freq: Every day | ORAL | Status: DC
Start: 2015-10-05 — End: 2015-12-01

## 2015-10-05 MED ORDER — ROSUVASTATIN CALCIUM 10 MG PO TABS
10.0000 mg | ORAL_TABLET | Freq: Every day | ORAL | Status: DC
Start: 2015-10-05 — End: 2016-03-03

## 2015-10-05 NOTE — Telephone Encounter (Signed)
lisinopril (PRINIVIL,ZESTRIL) 10 MG tablet  Medication   Date: 09/30/2015  Department: [address] Office  Ordering/Authorizing: Larey Dresser, MD      Order Providers    Prescribing Provider Encounter Provider   Larey Dresser, MD Chasitie Clyde Lundborg, CMA    Medication Detail      Disp Refills Start End     lisinopril (PRINIVIL,ZESTRIL) 10 MG tablet 90 tablet 3 09/30/2015     Sig - Route: Take 1 tablet (10 mg total) by mouth daily. - Oral    E-Prescribing Status: Receipt confirmed by pharmacy (09/30/2015 3:46 PM EDT)     Pharmacy    [id number] - [place], [place] - [address]   amLODipine (NORVASC) 5 MG tablet  Medication   Date: 09/30/2015  Department: [address] Office  Ordering/Authorizing: Larey Dresser, MD      Order Providers    Prescribing Provider Encounter Provider   Larey Dresser, MD Chasitie Clyde Lundborg, CMA    Medication Detail      Disp Refills Start End     amLODipine (NORVASC) 5 MG tablet 135 tablet 3 09/30/2015     Sig - Route: Take 1 tablet (5 mg total) by mouth daily. - Oral    E-Prescribing Status: Receipt confirmed by pharmacy (09/30/2015 3:46 PM EDT)     Pharmacy    [id number] - [place], [place] - [address]

## 2015-10-07 DIAGNOSIS — L738 Other specified follicular disorders: Secondary | ICD-10-CM | POA: Diagnosis not present

## 2015-10-07 DIAGNOSIS — L817 Pigmented purpuric dermatosis: Secondary | ICD-10-CM | POA: Diagnosis not present

## 2015-10-07 DIAGNOSIS — L570 Actinic keratosis: Secondary | ICD-10-CM | POA: Diagnosis not present

## 2015-10-07 DIAGNOSIS — L0101 Non-bullous impetigo: Secondary | ICD-10-CM | POA: Diagnosis not present

## 2015-10-11 DIAGNOSIS — R3129 Other microscopic hematuria: Secondary | ICD-10-CM | POA: Diagnosis not present

## 2015-10-21 DIAGNOSIS — R933 Abnormal findings on diagnostic imaging of other parts of digestive tract: Secondary | ICD-10-CM | POA: Diagnosis not present

## 2015-10-21 DIAGNOSIS — D490 Neoplasm of unspecified behavior of digestive system: Secondary | ICD-10-CM | POA: Diagnosis not present

## 2015-10-21 DIAGNOSIS — K862 Cyst of pancreas: Secondary | ICD-10-CM | POA: Diagnosis not present

## 2015-12-01 ENCOUNTER — Encounter: Payer: Self-pay | Admitting: Internal Medicine

## 2015-12-01 ENCOUNTER — Ambulatory Visit (INDEPENDENT_AMBULATORY_CARE_PROVIDER_SITE_OTHER): Payer: Medicare Other | Admitting: Internal Medicine

## 2015-12-01 VITALS — BP 118/68 | HR 68 | Ht 70.0 in | Wt 188.0 lb

## 2015-12-01 DIAGNOSIS — D150 Benign neoplasm of thymus: Secondary | ICD-10-CM

## 2015-12-01 DIAGNOSIS — J449 Chronic obstructive pulmonary disease, unspecified: Secondary | ICD-10-CM

## 2015-12-01 MED ORDER — TIOTROPIUM BROMIDE MONOHYDRATE 18 MCG IN CAPS: ORAL_CAPSULE | RESPIRATORY_TRACT | Status: DC

## 2015-12-01 NOTE — Progress Notes (Signed)
History of Present Illness Michael [name] is a 75 y.o. male former smoker  with COPD and Thymoma followed by Michael [name].   5/31/2017Follow Up Appointment: This has been a stable period for Michael [name]. No exacerbations. He is doing well on his Spiriva and Flonase. 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 We will follow up with a low dose CT scan for surveillance of the Thymoma. Tests CT Chest 08/2014: IMPRESSION: 1. No findings to suggest recurrent thymoma. 2. All previously noted tiny pulmonary nodules are stable in size compared to prior examinations and at this point are considered benign with no further imaging followup recommended. Mild scarring in the anterior aspect of the left upper lobe and in the lower lobes bilaterally is also unchanged and considered benign. 3. Atherosclerosis, including left main and 2 vessel coronary artery disease. Please note that although the presence of coronary artery calcium documents the presence of coronary artery disease, the severity of this disease and any potential stenosis cannot be assessed on this non-gated CT examination. Assessment for potential risk factor modification, dietary therapy or pharmacologic therapy may be warranted, if clinically indicated.  Past medical hx Past Medical History  Diagnosis Date  . Essential hypertension, benign   . Hyperlipidemia   . History of colonic polyps   . Lightheadedness     workup for this about 1.5 years ago included a 3 week event monitor and an echo, which were both unremarkable. Of note, patient was no longer having lightheaded spells when he wore the monitor  . GERD (gastroesophageal reflux disease)   . Diverticulosis   . COPD (chronic obstructive pulmonary disease) ([place])   . Emphysema of lung ([place])     copd version per pt.     Past surgical hx, Family hx, Social hx all reviewed.  Current Outpatient Prescriptions on File Prior to Visit  Medication  Sig  . ALPRAZolam (XANAX) 0.25 MG tablet Take 0.25 mg by mouth daily as needed. For anxiety  . aspirin EC 81 MG tablet Take 81 mg by mouth every evening.  . Calcium Carbonate-Vitamin D (CALCIUM + D PO) Take 1 tablet by mouth daily.  . Coenzyme Q10 (CO Q 10) 100 MG CAPS Take 300 mg by mouth daily.   . fluticasone (FLONASE) 50 MCG/ACT nasal spray Place 2 sprays into both nostrils daily.  Marland Kitchen ibuprofen (ADVIL,MOTRIN) 200 MG tablet Take 400 mg by mouth every 8 (eight) hours as needed for moderate pain.   Marland Kitchen lisinopril (PRINIVIL,ZESTRIL) 10 MG tablet Take 1 tablet (10 mg total) by mouth daily.  . rosuvastatin (CRESTOR) 10 MG tablet Take 1 tablet (10 mg total) by mouth daily.   No current facility-administered medications on file prior to visit.     No Known Allergies  Review Of Systems:  Constitutional:   No  weight loss, night sweats,  Fevers, chills, fatigue, or  lassitude.  HEENT:   No headaches,  Difficulty swallowing,  Tooth/dental problems, or  Sore throat,                No sneezing, itching, ear ache, nasal congestion, post nasal drip,   CV:  No chest pain,  Orthopnea, PND, swelling in lower extremities, anasarca, dizziness, palpitations, syncope.   GI  No heartburn, indigestion, abdominal pain, nausea, vomiting, diarrhea, change in bowel habits, loss of appetite, bloody stools.   Resp: No shortness of breath with exertion or at rest.  No excess mucus, no productive cough,  No  non-productive cough,  No coughing up of blood.  No change in color of mucus.  No wheezing.  No chest wall deformity  Skin: no rash or lesions.  GU: no dysuria, change in color of urine, no urgency or frequency.  No flank pain, no hematuria   MS:  No joint pain or swelling.  No decreased range of motion.  No back pain.  Psych:  No change in mood or affect. No depression or anxiety.  No memory loss.   Vital Signs BP 118/68 mmHg  Pulse 68  Ht 5\' 10"  (1.778 m)  Wt 188 lb (85.276 kg)  BMI 26.98 kg/m2   SpO2 94%   Physical Exam:  General- No distress,  A&Ox3, pleasant ENT: No sinus tenderness, TM clear, pale nasal mucosa, no oral exudate,no post nasal drip, no LAN Cardiac: S1, S2, regular rate and rhythm, no murmur Chest: No wheeze/ rales/ dullness; no accessory muscle use, no nasal flaring, no sternal retractions Abd.: Soft Non-tender Ext: No clubbing cyanosis, edema Neuro:  normal strength Skin: No rashes, warm and dry Psych: normal mood and behavior   Assessment/Plan  COPD (chronic obstructive pulmonary disease) Stable 9 months without exacerbations Plan: We will refill your Spiriva You can add loratidine 10 mg daily for allergies and post nasal drip. We will schedule a low dose CT scan of the chest  for follow up of your Thymoma. We will call you with the results. Follow up with Michael [name] in 9 months. Please contact office for sooner follow up if symptoms do not improve or worsen or seek emergency care     Thymoma, benign Plan: Low Dose CT Chest as follow up of Thymoma We will call patient with results     Michael Spatz, NP 12/01/2015  3:56 PM          STAFF NOTE: I, Michael [name] have personally reviewed patient's available data, including medical history, events of note, physical examination and test results as part of my evaluation. I have discussed with resident/NP and other care providers such as pharmacist, RN and RRT.  In addition,  I personally evaluated patient and elicited key findings of   S: no active complaints. Feels well. Mild occ sinus drainage - flonase helps. COmpliant with spiriva. This is 39mo followup. Last Scan was in feb 2016  O: cta bilaterally No thrush Mild post nasal drip +    A: Thymoma s/p resection stage 1 - repeat CT now Mild COP{D = continue spiriva as before Sinus drainag e- continue otc nasal steroid  P: ROV 24months or soonoer   .  Rest per NP/medical resident whose note is outlined above and that I agree  with  Michael. Brand [name], M.D., [facility]C.P Pulmonary and Critical Care Medicine Staff Physician [facility] Pager: [phone], If no answer or between  15:00h - 7:00h: call [phone]  12/01/2015 4:10 PM

## 2015-12-01 NOTE — Patient Instructions (Addendum)
We will refill your Spiriva You can add loratidine 10 mg daily for allergies and post nasal drip. We will schedule a low dose CT scan of the chest  for follow up of your Thymoma. We will call you with the results. Follow up with Dr. Chase Caller in 9 months. Remember to get your flu shotin August/ Sept. Please contact office for sooner follow up if symptoms do not improve or worsen or seek emergency care

## 2015-12-01 NOTE — Assessment & Plan Note (Signed)
Plan: Low Dose CT Chest as follow up of Thymoma We will call patient with results

## 2015-12-01 NOTE — Assessment & Plan Note (Signed)
Stable 9 months without exacerbations Plan: We will refill your Spiriva You can add loratidine 10 mg daily for allergies and post nasal drip. We will schedule a low dose CT scan of the chest  for follow up of your Thymoma. We will call you with the results. Follow up with Dr. Chase Caller in 9 months. Please contact office for sooner follow up if symptoms do not improve or worsen or seek emergency care

## 2015-12-06 ENCOUNTER — Telehealth: Payer: Self-pay | Admitting: Cardiology

## 2015-12-06 MED ORDER — AMLODIPINE BESYLATE 5 MG PO TABS: ORAL_TABLET | ORAL | Status: DC

## 2015-12-06 NOTE — Telephone Encounter (Signed)
Called pt and spoke with pt's wife, informing her that the pt needed to make an overdue yearly appointment and that I would send in a 30 day supply for now and that the pt could not get anymore refills until the appointment has been made. Wife verbalized understanding.

## 2015-12-06 NOTE — Telephone Encounter (Signed)
New message     The pt wanted his prescriptions called into [facility] instead of [facility]   The pt had to use [facility], but did not want to cause of the cost but the pt was out of pills    *STAT* If patient is at the pharmacy, call can be transferred to refill team.   1. Which medications need to be refilled? (please list name of each medication and dose if known) Norvasc the pt was taking 1 and 1/2 pills daily but when the pt picked-up the prescription from [facility] the bottle read 1 pill daily now the pt is about out again, the pt stated he is suppose to have 135 pills in total for 1 prescription  2. Which pharmacy/location (including street and city if local pharmacy) is medication to be sent to? [facility]  3. Do they need a 30 day or 90 day supply? 90 days   The pt just stated he will be leaving town shortly and this matter of his prescriptions have not been straightened out yet but the Norvasc will need to be sent back in so the pt can have the medication he just wants to know who he needs to speak with.

## 2015-12-06 NOTE — Telephone Encounter (Signed)
New Message  Pt called states that he is calling back to discuss his refills/ He states that he was advised to make an appt. Offered to assist with the appt pt states that he would like to speak with [facility] in refills before he made the appt. Please assist

## 2015-12-07 NOTE — Telephone Encounter (Signed)
Spoke with patient in Cathy's absence and he was questioning why he did not receive a letter or phone call instructing him that it was time to schedule an appointment. I explained to him that I was not sure but per his last office visit, he was to call the office to schedule if he did not receive any reminder. I asked him if he was willing to schedule his appointment today, he answered yes and I transferred his call to the scheduling dept.

## 2015-12-09 ENCOUNTER — Ambulatory Visit (INDEPENDENT_AMBULATORY_CARE_PROVIDER_SITE_OTHER)
Admission: RE | Admit: 2015-12-09 | Discharge: 2015-12-09 | Disposition: A | Discharge status: Home / Self Care | Payer: Medicare Other | Source: Ambulatory Visit | Attending: Internal Medicine | Admitting: Internal Medicine

## 2015-12-09 DIAGNOSIS — D150 Benign neoplasm of thymus: Secondary | ICD-10-CM

## 2015-12-09 DIAGNOSIS — C37 Malignant neoplasm of thymus: Secondary | ICD-10-CM | POA: Diagnosis not present

## 2016-01-12 DIAGNOSIS — R338 Other retention of urine: Secondary | ICD-10-CM | POA: Diagnosis not present

## 2016-01-12 DIAGNOSIS — N401 Enlarged prostate with lower urinary tract symptoms: Secondary | ICD-10-CM | POA: Diagnosis not present

## 2016-01-24 ENCOUNTER — Observation Stay (HOSPITAL_COMMUNITY)
Admission: EM | Admit: 2016-01-24 | Discharge: 2016-01-25 | Disposition: A | Discharge status: Home / Self Care | Payer: Medicare Other | Attending: Interventional Cardiology | Admitting: Interventional Cardiology

## 2016-01-24 ENCOUNTER — Encounter (HOSPITAL_COMMUNITY): Payer: Self-pay | Admitting: Nurse Practitioner

## 2016-01-24 ENCOUNTER — Emergency Department (HOSPITAL_COMMUNITY): Payer: Medicare Other

## 2016-01-24 DIAGNOSIS — R42 Dizziness and giddiness: Secondary | ICD-10-CM | POA: Diagnosis not present

## 2016-01-24 DIAGNOSIS — I200 Unstable angina: Secondary | ICD-10-CM | POA: Diagnosis not present

## 2016-01-24 DIAGNOSIS — F419 Anxiety disorder, unspecified: Secondary | ICD-10-CM | POA: Diagnosis not present

## 2016-01-24 DIAGNOSIS — I700 Atherosclerosis of aorta: Secondary | ICD-10-CM | POA: Insufficient documentation

## 2016-01-24 DIAGNOSIS — E785 Hyperlipidemia, unspecified: Secondary | ICD-10-CM | POA: Diagnosis not present

## 2016-01-24 DIAGNOSIS — R7302 Impaired glucose tolerance (oral): Secondary | ICD-10-CM | POA: Diagnosis not present

## 2016-01-24 DIAGNOSIS — N179 Acute kidney failure, unspecified: Secondary | ICD-10-CM | POA: Diagnosis not present

## 2016-01-24 DIAGNOSIS — R0789 Other chest pain: Secondary | ICD-10-CM | POA: Diagnosis not present

## 2016-01-24 DIAGNOSIS — K219 Gastro-esophageal reflux disease without esophagitis: Secondary | ICD-10-CM | POA: Diagnosis present

## 2016-01-24 DIAGNOSIS — Z87891 Personal history of nicotine dependence: Secondary | ICD-10-CM | POA: Diagnosis not present

## 2016-01-24 DIAGNOSIS — J449 Chronic obstructive pulmonary disease, unspecified: Secondary | ICD-10-CM | POA: Diagnosis present

## 2016-01-24 DIAGNOSIS — D150 Benign neoplasm of thymus: Secondary | ICD-10-CM | POA: Diagnosis not present

## 2016-01-24 DIAGNOSIS — I129 Hypertensive chronic kidney disease with stage 1 through stage 4 chronic kidney disease, or unspecified chronic kidney disease: Secondary | ICD-10-CM | POA: Insufficient documentation

## 2016-01-24 DIAGNOSIS — I2584 Coronary atherosclerosis due to calcified coronary lesion: Secondary | ICD-10-CM | POA: Diagnosis not present

## 2016-01-24 DIAGNOSIS — I25110 Atherosclerotic heart disease of native coronary artery with unstable angina pectoris: Principal | ICD-10-CM | POA: Insufficient documentation

## 2016-01-24 DIAGNOSIS — Z7982 Long term (current) use of aspirin: Secondary | ICD-10-CM | POA: Insufficient documentation

## 2016-01-24 DIAGNOSIS — N189 Chronic kidney disease, unspecified: Secondary | ICD-10-CM | POA: Diagnosis present

## 2016-01-24 DIAGNOSIS — R071 Chest pain on breathing: Secondary | ICD-10-CM | POA: Diagnosis not present

## 2016-01-24 DIAGNOSIS — I2510 Atherosclerotic heart disease of native coronary artery without angina pectoris: Secondary | ICD-10-CM | POA: Diagnosis present

## 2016-01-24 DIAGNOSIS — R079 Chest pain, unspecified: Secondary | ICD-10-CM | POA: Diagnosis not present

## 2016-01-24 DIAGNOSIS — I10 Essential (primary) hypertension: Secondary | ICD-10-CM | POA: Diagnosis present

## 2016-01-24 HISTORY — DX: Chronic kidney disease, unspecified: N18.9

## 2016-01-24 HISTORY — DX: Atherosclerotic heart disease of native coronary artery without angina pectoris: I25.10

## 2016-01-24 LAB — BASIC METABOLIC PANEL
Anion gap: 7 (ref 5–15)
BUN: 17 mg/dL (ref 6–20)
CHLORIDE: 105 mmol/L (ref 101–111)
CO2: 24 mmol/L (ref 22–32)
Calcium: 9 mg/dL (ref 8.9–10.3)
Creatinine, Ser: 1.43 mg/dL — ABNORMAL HIGH (ref 0.61–1.24)
GFR calc Af Amer: 55 mL/min — ABNORMAL LOW (ref >60)
GFR calc non Af Amer: 47 mL/min — ABNORMAL LOW (ref >60)
Glucose, Bld: 103 mg/dL — ABNORMAL HIGH (ref 65–99)
POTASSIUM: 4.5 mmol/L (ref 3.5–5.1)
Sodium: 136 mmol/L (ref 135–145)

## 2016-01-24 LAB — CBC
HEMATOCRIT: 43.6 % (ref 39.0–52.0)
Hemoglobin: 14.9 g/dL (ref 13.0–17.0)
MCH: 31.6 pg (ref 26.0–34.0)
MCHC: 34.2 g/dL (ref 30.0–36.0)
MCV: 92.4 fL (ref 78.0–100.0)
Platelets: 220 10*3/uL (ref 150–400)
RBC: 4.72 MIL/uL (ref 4.22–5.81)
RDW: 12.9 % (ref 11.5–15.5)
WBC: 8.1 10*3/uL (ref 4.0–10.5)

## 2016-01-24 LAB — I-STAT TROPONIN, ED
TROPONIN I, POC: 0 ng/mL (ref 0.00–0.08)
Troponin i, poc: 0 ng/mL (ref 0.00–0.08)

## 2016-01-24 LAB — TSH: TSH: 2.268 u[IU]/mL (ref 0.350–4.500)

## 2016-01-24 LAB — TROPONIN I

## 2016-01-24 MED ORDER — SODIUM CHLORIDE 0.9 % WEIGHT BASED INFUSION
1.0000 mL/kg/h | INTRAVENOUS | Status: DC
  Administered 2016-01-24: 1 mL/kg/h via INTRAVENOUS

## 2016-01-24 MED ORDER — ONDANSETRON HCL 4 MG/2ML IJ SOLN: 4.0000 mg | Freq: Four times a day (QID) | INTRAMUSCULAR | Status: DC | PRN

## 2016-01-24 MED ORDER — AMLODIPINE BESYLATE 2.5 MG PO TABS: 2.5000 mg | ORAL_TABLET | ORAL | Status: DC

## 2016-01-24 MED ORDER — TIOTROPIUM BROMIDE MONOHYDRATE 18 MCG IN CAPS
18.0000 ug | ORAL_CAPSULE | Freq: Every day | RESPIRATORY_TRACT | Status: DC
  Filled 2016-01-24: qty 5

## 2016-01-24 MED ORDER — TAMSULOSIN HCL 0.4 MG PO CAPS
0.4000 mg | ORAL_CAPSULE | Freq: Every day | ORAL | Status: DC
  Administered 2016-01-24: 0.4 mg via ORAL
  Filled 2016-01-24: qty 1

## 2016-01-24 MED ORDER — COENZYME Q10 100 MG PO CAPS: 1.0000 | ORAL_CAPSULE | Freq: Every day | ORAL | Status: DC

## 2016-01-24 MED ORDER — SODIUM CHLORIDE 0.9 % IV SOLN
Freq: Once | INTRAVENOUS | Status: AC
  Administered 2016-01-24: 17:00:00 via INTRAVENOUS

## 2016-01-24 MED ORDER — AMLODIPINE BESYLATE 2.5 MG PO TABS
2.5000 mg | ORAL_TABLET | Freq: Every day | ORAL | Status: DC
  Administered 2016-01-24: 2.5 mg via ORAL
  Filled 2016-01-24: qty 1

## 2016-01-24 MED ORDER — ACETAMINOPHEN 325 MG PO TABS: 650.0000 mg | ORAL_TABLET | ORAL | Status: DC | PRN

## 2016-01-24 MED ORDER — SODIUM CHLORIDE 0.9% FLUSH: 3.0000 mL | INTRAVENOUS | Status: DC | PRN

## 2016-01-24 MED ORDER — NITROGLYCERIN 0.4 MG SL SUBL: 0.4000 mg | SUBLINGUAL_TABLET | SUBLINGUAL | Status: DC | PRN

## 2016-01-24 MED ORDER — SODIUM CHLORIDE 0.9% FLUSH
3.0000 mL | Freq: Two times a day (BID) | INTRAVENOUS | Status: DC
  Administered 2016-01-24: 3 mL via INTRAVENOUS

## 2016-01-24 MED ORDER — SODIUM CHLORIDE 0.9 % IV SOLN: 250.0000 mL | INTRAVENOUS | Status: DC | PRN

## 2016-01-24 MED ORDER — ASPIRIN 81 MG PO CHEW
81.0000 mg | CHEWABLE_TABLET | ORAL | Status: AC
  Administered 2016-01-25: 81 mg via ORAL
  Filled 2016-01-24: qty 1

## 2016-01-24 MED ORDER — ALPRAZOLAM 0.25 MG PO TABS: 0.2500 mg | ORAL_TABLET | Freq: Every evening | ORAL | Status: DC | PRN

## 2016-01-24 MED ORDER — ROSUVASTATIN CALCIUM 10 MG PO TABS
10.0000 mg | ORAL_TABLET | Freq: Every day | ORAL | Status: DC
  Administered 2016-01-24: 10 mg via ORAL
  Filled 2016-01-24: qty 1

## 2016-01-24 MED ORDER — ASPIRIN EC 81 MG PO TBEC: 81.0000 mg | DELAYED_RELEASE_TABLET | Freq: Every day | ORAL | Status: DC

## 2016-01-24 MED ORDER — AMLODIPINE BESYLATE 5 MG PO TABS
5.0000 mg | ORAL_TABLET | Freq: Every day | ORAL | Status: DC
  Filled 2016-01-24: qty 1

## 2016-01-24 MED ORDER — LORATADINE 10 MG PO TABS: 10.0000 mg | ORAL_TABLET | Freq: Every day | ORAL | Status: DC | PRN

## 2016-01-24 MED ORDER — HEPARIN (PORCINE) IN NACL 100-0.45 UNIT/ML-% IJ SOLN
1050.0000 [IU]/h | INTRAMUSCULAR | Status: DC
  Administered 2016-01-24: 1150 [IU]/h via INTRAVENOUS
  Filled 2016-01-24: qty 250

## 2016-01-24 MED ORDER — HEPARIN BOLUS VIA INFUSION
4000.0000 [IU] | Freq: Once | INTRAVENOUS | Status: AC
  Administered 2016-01-24: 4000 [IU] via INTRAVENOUS
  Filled 2016-01-24: qty 4000

## 2016-01-24 NOTE — ED Notes (Signed)
Cards at bedside

## 2016-01-24 NOTE — Progress Notes (Signed)
ANTICOAGULATION CONSULT NOTE - Initial Consult  Pharmacy Consult for Heparin Indication: chest pain/ACS  No Known Allergies  Patient Measurements: Height: 5\' 10"  (177.8 cm) Weight: 182 lb 4.8 oz (82.7 kg) IBW/kg (Calculated) : 73 Heparin Dosing Weight: 83 kg  Vital Signs: Temp: 98.4 F (36.9 C) (07/24 1428) Temp Source: Oral (07/24 1428) BP: 135/80 (07/24 1930) Pulse Rate: 86 (07/24 1930)  Labs:  Recent Labs  01/24/16 1431  HGB 14.9  HCT 43.6  PLT 220  CREATININE 1.43*    Estimated Creatinine Clearance: 48.2 mL/min (by C-G formula based on SCr of 1.43 mg/dL).   Medical History: Past Medical History:  Diagnosis Date  . COPD (chronic obstructive pulmonary disease) ([place])   . Diverticulosis   . Emphysema of lung ([place])    copd version per pt.  . Essential hypertension, benign   . GERD (gastroesophageal reflux disease)   . History of colonic polyps   . Hyperlipidemia   . Lightheadedness    workup for this about 1.5 years ago included a 3 week event monitor and an echo, which were both unremarkable. Of note, patient was no longer having lightheaded spells when he wore the monitor    Medications:  Prescriptions Prior to Admission  Medication Sig Dispense Refill Last Dose  . ALPRAZolam (XANAX) 0.25 MG tablet Take 0.25 mg by mouth daily as needed. For anxiety   01/24/2016 at am  . amLODipine (NORVASC) 5 MG tablet Take 5 mg tablet by mouth in the AM and 2.5 mg tablet by mouth in the PM. (Patient taking differently: Take 2.5-5 mg by mouth See admin instructions. 5 mg in the morning and 2.5 mg in the evening) 45 tablet 0 01/24/2016 at 0930  . aspirin EC 81 MG tablet Take 81 mg by mouth every evening.   01/23/2016 at pm  . Calcium Carbonate-Vitamin D (CALCIUM + D PO) Take 1 tablet by mouth daily.   01/24/2016 at am  . Coenzyme Q10 ([employer] COENZYME Q-10) 100 MG capsule Take 1 capsule by mouth daily.   01/24/2016 at am  . fluticasone (FLONASE) 50 MCG/ACT nasal spray Place 2 sprays  into both nostrils daily. (Patient taking differently: Place 2 sprays into both nostrils daily as needed for allergies. ) 16 g 5 01/23/2016 at pm  . ibuprofen (ADVIL,MOTRIN) 200 MG tablet Take 400 mg by mouth every 8 (eight) hours as needed for mild pain or moderate pain.    Past Week at Unknown time  . lisinopril (PRINIVIL,ZESTRIL) 10 MG tablet Take 1 tablet (10 mg total) by mouth daily. 90 tablet 3 01/24/2016 at 0930  . loratadine (CLARITIN) 10 MG tablet Take 10 mg by mouth daily as needed for allergies.   01/24/2016 at am  . Multiple Vitamin (ONE-A-DAY 55 PLUS PO) Take 1 tablet by mouth at bedtime.   01/23/2016 at pm  . rosuvastatin (CRESTOR) 10 MG tablet Take 1 tablet (10 mg total) by mouth daily. 90 tablet 3 01/23/2016 at pm  . tamsulosin (FLOMAX) 0.4 MG CAPS capsule Take 0.4 mg by mouth at bedtime.   01/23/2016 at pm  . tiotropium (SPIRIVA HANDIHALER) 18 MCG inhalation capsule INHALE THE CONTENTS OF 1  CAPSULE VIA HANDIHALER  DAILY. 90 capsule 3 01/24/2016 at am  . UNABLE TO FIND Cocoa extract: Take 2 capsules by mouth nightly   01/23/2016 at pm   Scheduled:  . amLODipine  2.5-5 mg Oral See admin instructions  . [START ON 01/25/2016] aspirin  81 mg Oral Pre-Cath  . [START  ON 01/25/2016] aspirin EC  81 mg Oral Daily  . rosuvastatin  10 mg Oral Daily  . sodium chloride flush  3 mL Intravenous Q12H  . tamsulosin  0.4 mg Oral QHS  . [START ON 01/25/2016] tiotropium  18 mcg Inhalation Daily   Infusions:  . sodium chloride      Assessment: 75yo male with history of HTN, HLD, GERD and COPD presents with chest tightness. Pharmacy is consulted to dose heparin for ACS/chest pain.  Goal of Therapy:  Heparin level 0.3-0.7 units/ml Monitor platelets by anticoagulation protocol: Yes   Plan:  Give 4000 units bolus x 1 Start heparin infusion at 1150 units/hr Check anti-Xa level in 8 hours and daily while on heparin Continue to monitor H&H and platelets  Andrey Cota. Diona Foley, PharmD, [place] Clinical  Pharmacist Pager [phone] 01/24/2016,8:07 PM

## 2016-01-24 NOTE — ED Notes (Signed)
Cardiology at bedside.

## 2016-01-24 NOTE — ED Notes (Signed)
Patient transported to X-ray 

## 2016-01-24 NOTE — ED Provider Notes (Signed)
[facility] DEPT Provider Note   CSN: [id number] Arrival date & time: 01/24/16  1409  First Provider Contact:  First MD Initiated Contact with Patient 01/24/16 1428        History   Chief Complaint Chief Complaint  Patient presents with  . Chest Pain    HPI Michael [name] is a 75 y.o. male.  The history is provided by the patient and medical records.  Chest Pain   This is a new problem. The current episode started 1 to 2 hours ago. The problem occurs rarely. The problem has been gradually worsening. The pain is associated with exertion. The pain is present in the substernal region and lateral region. The pain is at a severity of 6/10. The pain is moderate. The quality of the pain is described as pressure-like, heavy, exertional and dull. The pain radiates to the left shoulder. Duration of episode(s) is 30 minutes. The symptoms are aggravated by exertion. Associated symptoms include dizziness, malaise/fatigue and shortness of breath. Pertinent negatives include no abdominal pain, no cough, no diaphoresis, no fever, no headaches, no lower extremity edema, no nausea and no vomiting. He has tried rest and nitroglycerin for the symptoms. The treatment provided significant relief. Risk factors include being elderly, male gender and smoking/tobacco exposure.  His past medical history is significant for hyperlipidemia and hypertension.  Pertinent negatives for past medical history include no MI and no PE.  His family medical history is significant for CAD.  Procedure history is positive for stress thallium.  Procedure history is negative for cardiac catheterization.    Past Medical History:  Diagnosis Date  . COPD (chronic obstructive pulmonary disease) ([place])   . Diverticulosis   . Emphysema of lung ([place])    copd version per pt.  . Essential hypertension, benign   . GERD (gastroesophageal reflux disease)   . History of colonic polyps   . Hyperlipidemia   . Lightheadedness    workup for this about 1.5 years ago included a 3 week event monitor and an echo, which were both unremarkable. Of note, patient was no longer having lightheaded spells when he wore the monitor    Patient Active Problem List   Diagnosis Date Noted  . Unstable angina ([place]) 01/24/2016  . Other emphysema ([place]) 03/03/2015  . Lung nodule 03/03/2015  . COLD (chronic obstructive lung disease) ([place]) 06/05/2014  . Post-nasal drip 06/05/2014  . 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 10/08/2013  . Nonspecific (abnormal) findings on radiological and other examination of gastrointestinal tract 09/18/2013  . Pancreatic lesion 08/22/2013  . Dry mouth 08/03/2013  . Thymoma, benign 08/26/2012  . Cancer screening 08/12/2012  . COPD (chronic obstructive pulmonary disease) ([place]) 07/30/2012  . Hyperlipidemia 08/25/2010  . HYPERTENSION, BENIGN 08/25/2010  . DIZZINESS 08/25/2010  . Chest pain 08/25/2010    Past Surgical History:  Procedure Laterality Date  . CATARACT EXTRACTION    . COLONOSCOPY    . EUS N/A 09/18/2013   Procedure: UPPER ENDOSCOPIC ULTRASOUND (EUS) LINEAR;  Surgeon: [name], MD;  Location: [facility];  Service: Endoscopy;  Laterality: N/A;  radial linear  . EYE SURGERY Right    muscle   . FLEXIBLE BRONCHOSCOPY N/A 09/03/2012   Procedure: FLEXIBLE BRONCHOSCOPY;  Surgeon: Grace Isaac, MD;  Location: [facility] OR;  Service: Thoracic;  Laterality: N/A;  . POLYPECTOMY    . VIDEO ASSISTED THORACOSCOPY (VATS)/WEDGE RESECTION Left 09/03/2012   Procedure: VIDEO ASSISTED THORACOSCOPY (VATS) ,Resection Anterior Mediastinal Mass;  Surgeon:  Grace Isaac, MD;  Location: [facility];  Service: Thoracic;  Laterality: Left;       Home Medications    Prior to Admission medications   Medication Sig Start Date End Date Taking? Authorizing Provider  ALPRAZolam (XANAX) 0.25 MG tablet Take 0.25 mg by mouth daily as needed. For anxiety   Yes Historical Provider,  MD  amLODipine (NORVASC) 5 MG tablet Take 5 mg tablet by mouth in the AM and 2.5 mg tablet by mouth in the PM. Patient taking differently: Take 2.5-5 mg by mouth See admin instructions. 5 mg in the morning and 2.5 mg in the evening 12/06/15  Yes Larey Dresser, MD  aspirin EC 81 MG tablet Take 81 mg by mouth every evening.   Yes Historical Provider, MD  Calcium Carbonate-Vitamin D (CALCIUM + D PO) Take 1 tablet by mouth daily.   Yes Historical Provider, MD  Coenzyme Q10 ([employer] COENZYME Q-10) 100 MG capsule Take 1 capsule by mouth daily.   Yes Historical Provider, MD  fluticasone (FLONASE) 50 MCG/ACT nasal spray Place 2 sprays into both nostrils daily. Patient taking differently: Place 2 sprays into both nostrils daily as needed for allergies.  03/03/15  Yes Brand Males, MD  ibuprofen (ADVIL,MOTRIN) 200 MG tablet Take 400 mg by mouth every 8 (eight) hours as needed for mild pain or moderate pain.    Yes Historical Provider, MD  lisinopril (PRINIVIL,ZESTRIL) 10 MG tablet Take 1 tablet (10 mg total) by mouth daily. 10/05/15  Yes Larey Dresser, MD  loratadine (CLARITIN) 10 MG tablet Take 10 mg by mouth daily as needed for allergies.   Yes Historical Provider, MD  Multiple Vitamin (ONE-A-DAY 55 PLUS PO) Take 1 tablet by mouth at bedtime.   Yes Historical Provider, MD  rosuvastatin (CRESTOR) 10 MG tablet Take 1 tablet (10 mg total) by mouth daily. 10/05/15  Yes Larey Dresser, MD  tamsulosin (FLOMAX) 0.4 MG CAPS capsule Take 0.4 mg by mouth at bedtime.   Yes Historical Provider, MD  tiotropium (SPIRIVA HANDIHALER) 18 MCG inhalation capsule INHALE THE CONTENTS OF 1  CAPSULE VIA HANDIHALER  DAILY. 12/01/15  Yes Brand Males, MD  UNABLE TO FIND Cocoa extract: Take 2 capsules by mouth nightly   Yes Historical Provider, MD    Family History Family History  Problem Relation Age of Onset  . Emphysema Father   . Cancer Mother   . Coronary artery disease Neg Hx   . Colon cancer Neg Hx   . Esophageal  cancer Neg Hx   . Rectal cancer Neg Hx   . Stomach cancer Neg Hx     Social History Social History  Substance Use Topics  . Smoking status: Former Smoker    Packs/day: 1.00    Years: 25.00    Types: Cigarettes    Quit date: 07/03/1982  . Smokeless tobacco: Never Used  . Alcohol use 7.0 oz/week    14 Standard drinks or equivalent per week     Allergies   Review of patient's allergies indicates no known allergies.   Review of Systems Review of Systems  Constitutional: Positive for fatigue and malaise/fatigue. Negative for diaphoresis and fever.  HENT: Negative for congestion and rhinorrhea.   Eyes: Negative for visual disturbance.  Respiratory: Positive for shortness of breath. Negative for cough.   Cardiovascular: Positive for chest pain. Negative for leg swelling.  Gastrointestinal: Negative for abdominal pain, diarrhea, nausea and vomiting.  Genitourinary: Negative for dysuria and flank pain.  Musculoskeletal:  Negative for neck pain.  Skin: Negative for rash.  Allergic/Immunologic: Negative for immunocompromised state.  Neurological: Positive for dizziness. Negative for headaches.     Physical Exam Updated Vital Signs BP 135/80   Pulse 85   Temp 97.7 F (36.5 C) (Oral)   Resp 18   Ht 5\' 10"  (1.778 m)   Wt 182 lb 4.8 oz (82.7 kg)   SpO2 99%   BMI 26.16 kg/m   Physical Exam  Constitutional: He appears well-developed and well-nourished. No distress.  HENT:  Head: Normocephalic.  Mouth/Throat: Oropharynx is clear and moist. No oropharyngeal exudate.  Eyes: Conjunctivae are normal. Pupils are equal, round, and reactive to light.  Neck: Normal range of motion. Neck supple.  Cardiovascular: Normal rate, regular rhythm and normal heart sounds.  Exam reveals no friction rub.   No murmur heard. Pulmonary/Chest: Effort normal and breath sounds normal. No respiratory distress. He has no wheezes. He has no rales.  Abdominal: Soft. Bowel sounds are normal. He exhibits  no distension. There is no tenderness.  Musculoskeletal: He exhibits no edema.  Neurological: He is alert. He exhibits normal muscle tone.  Skin: Skin is warm.  Nursing note and vitals reviewed.    ED Treatments / Results  Labs (all labs ordered are listed, but only abnormal results are displayed) Labs Reviewed  BASIC METABOLIC PANEL - Abnormal; Notable for the following:       Result Value   Glucose, Bld 103 (*)    Creatinine, Ser 1.43 (*)    GFR calc non Af Amer 47 (*)    GFR calc Af Amer 55 (*)    All other components within normal limits  CBC  TSH  TROPONIN I  TROPONIN I  TROPONIN I  HEMOGLOBIN 123XX123  BASIC METABOLIC PANEL  LIPID PANEL  CBC  HEPARIN LEVEL (UNFRACTIONATED)  I-STAT TROPOININ, ED  I-STAT TROPOININ, ED    EKG  EKG Interpretation  Date/Time:  Monday January 24 2016 14:15:20 EDT Ventricular Rate:  75 PR Interval:    QRS Duration: 101 QT Interval:  377 QTC Calculation: 421 R Axis:   -12 Text Interpretation:  Sinus rhythm Atrial premature complex Abnormal R-wave progression, early transition T wave inversion in lead III No significant change since last tracing Confirmed by Jacia Sickman MD, Lysbeth Galas [phone]) on 01/24/2016 6:36:12 PM       Radiology Dg Chest 2 View  Result Date: 01/24/2016 CLINICAL DATA:  Central chest pain radiating to left side of the chest. EXAM: CHEST  2 VIEW COMPARISON:  CT of the chest 12/09/2015 FINDINGS: Cardiomediastinal silhouette is normal. Mediastinal contours appear intact. Tortuosity and calcified atherosclerotic disease of the aorta are noted. There is no evidence of focal airspace consolidation, pleural effusion or pneumothorax. Osseous structures are without acute abnormality. Soft tissues are grossly normal. IMPRESSION: No active cardiopulmonary disease. Atherosclerotic disease of the aorta. Electronically Signed   By: Fidela Salisbury M.D.   On: 01/24/2016 15:19   Procedures Procedures (including critical care  time)  Medications Ordered in ED Medications  loratadine (CLARITIN) tablet 10 mg (not administered)  tamsulosin (FLOMAX) capsule 0.4 mg (not administered)  tiotropium (SPIRIVA) inhalation capsule 18 mcg (not administered)  rosuvastatin (CRESTOR) tablet 10 mg (not administered)  ALPRAZolam (XANAX) tablet 0.25 mg (not administered)  aspirin EC tablet 81 mg (not administered)  nitroGLYCERIN (NITROSTAT) SL tablet 0.4 mg (not administered)  acetaminophen (TYLENOL) tablet 650 mg (not administered)  ondansetron (ZOFRAN) injection 4 mg (not administered)  sodium chloride flush (NS) 0.9 %  injection 3 mL (not administered)  sodium chloride flush (NS) 0.9 % injection 3 mL (not administered)  0.9 %  sodium chloride infusion (not administered)  aspirin chewable tablet 81 mg (not administered)  0.9% sodium chloride infusion (not administered)  heparin bolus via infusion 4,000 Units (not administered)  heparin ADULT infusion 100 units/mL (25000 units/230mL sodium chloride 0.45%) (not administered)  amLODipine (NORVASC) tablet 5 mg (not administered)    And  amLODipine (NORVASC) tablet 2.5 mg (not administered)  0.9 %  sodium chloride infusion ( Intravenous Stopped 01/24/16 2000)     Initial Impression / Assessment and Plan / ED Course  I have reviewed the triage vital signs and the nursing notes.  Pertinent labs & imaging results that were available during my care of the patient were reviewed by me and considered in my medical decision making (see chart for details).  Clinical Course   75 yo M with PMHx of HTN, HLD, CAD on prior CT coronary who p/w exertional, substernal CP that resolved with rest and nitro. VSS and WNL. EKG non-ischemic. Initial labs unremarkable and trop negative. History concerning for ACS/angina, however. Discussed with Cardiology. Last stress test >3 years ago. Will admit for possible cath in AM. No CP currently. ASA given. CXR clear, no signs of pulm pathology. No tachypnea,  tachycardia, or signs of PE. Pain is resolved, not sharp, not tearing, doubt dissection and pulses are symmetric.  Final Clinical Impressions(s) / ED Diagnoses   Final diagnoses:  Chest pain with high risk for cardiac etiology    New Prescriptions Current Discharge Medication List       Duffy Bruce, MD 01/24/16 2041

## 2016-01-24 NOTE — ED Notes (Signed)
Report given to [place] RN from [place]

## 2016-01-24 NOTE — ED Notes (Signed)
Attempted report 

## 2016-01-24 NOTE — H&P (Signed)
Patient ID: QUINTARIUS DRYSDALE MRN: [id number], DOB/AGE: September 19, 1940   Admit date: 01/24/2016  Primary Physician: Donnajean Lopes, MD Primary Cardiologist: Dr. Aundra Dubin (Non CHF)  Pt. Profile:  Michael [name] is a 75 y.o. male with a history of  HTN, HLD, GERD, lightheadedness, COPD, thymoma s/p resection 2014, Intraductal papillary mucinous neoplasm (followed at [facility] - stable) who presented 01/27/16 to [facility] ED for evaluation of chest tightness.   HPI: As above. 09/2010, he had an ETT-myoview for atypical chest pain that showed no evidence for ischemia or infarction. Chronic SOB due to COPD is stable. He was doing well on cardiac stand point of view when seen last by Dr. Aundra Dubin 08/06/14.  He was in [place] until his afternoon when he has episode of substernal chest tightness while playing in with dog at store, it was subsided with rest. He went home again had chest tightness while walking with dog. Chronic stable sob. Has separate left shoulder and back pain. EMS was called and his tightness resolved after SL nitro x 2. Given 100cc of NS due to BP of 84/44. Chest pain free since here. About week ago had similar episode with diaphoresis. He walks with dog and does house hold stuff without angina or significant chest pain. Denies orthopnea, PND or syncope. + intermittent LE edema.   Scr of 1.43. POC troponin negative. CXR with out active disease. Atherosclerotic disease of the aorta. EKG showed sinus rhythm with PVCs at rate of 75 bpm, no acute changes. Currently chest pain free.   Problem List  Past Medical History:  Diagnosis Date  . COPD (chronic obstructive pulmonary disease) ([place])   . Diverticulosis   . Emphysema of lung ([place])    copd version per pt.  . Essential hypertension, benign   . GERD (gastroesophageal reflux disease)   . History of colonic polyps   . Hyperlipidemia   . Lightheadedness    workup for this about 1.5 years ago included a 3 week event monitor and an echo, which were  both unremarkable. Of note, patient was no longer having lightheaded spells when he wore the monitor    Past Surgical History:  Procedure Laterality Date  . CATARACT EXTRACTION    . COLONOSCOPY    . EUS N/A 09/18/2013   Procedure: UPPER ENDOSCOPIC ULTRASOUND (EUS) LINEAR;  Surgeon: [name], MD;  [facility];  Service: Endoscopy;  Laterality: N/A;  radial linear  . EYE SURGERY Right    muscle   . FLEXIBLE BRONCHOSCOPY N/A 09/03/2012   Procedure: FLEXIBLE BRONCHOSCOPY;  Surgeon: Grace Isaac, MD;  [facility];  Service: Thoracic;  Laterality: N/A;  . POLYPECTOMY    . VIDEO ASSISTED THORACOSCOPY (VATS)/WEDGE RESECTION Left 09/03/2012   Procedure: VIDEO ASSISTED THORACOSCOPY (VATS) ,Resection Anterior Mediastinal Mass;  Surgeon: Grace Isaac, MD;  Location: [facility];  Service: Thoracic;  Laterality: Left;     Allergies  No Known Allergies   Home Medications  Prior to Admission medications   Medication Sig Start Date End Date Taking? Authorizing Provider  ALPRAZolam (XANAX) 0.25 MG tablet Take 0.25 mg by mouth daily as needed. For anxiety   Yes Historical Provider, MD  amLODipine (NORVASC) 5 MG tablet Take 5 mg tablet by mouth in the AM and 2.5 mg tablet by mouth in the PM. Patient taking differently: Take 2.5-5 mg by mouth See admin instructions. 5 mg in the morning and 2.5 mg in the evening 12/06/15  Yes Larey Dresser,  MD  aspirin EC 81 MG tablet Take 81 mg by mouth every evening.   Yes Historical Provider, MD  Calcium Carbonate-Vitamin D (CALCIUM + D PO) Take 1 tablet by mouth daily.   Yes Historical Provider, MD  Coenzyme Q10 ([employer]S COENZYME Q-10) 100 MG capsule Take 1 capsule by mouth daily.   Yes Historical Provider, MD  fluticasone (FLONASE) 50 MCG/ACT nasal spray Place 2 sprays into both nostrils daily. Patient taking differently: Place 2 sprays into both nostrils daily as needed for allergies.  03/03/15  Yes Brand Males, MD  ibuprofen (ADVIL,MOTRIN)  200 MG tablet Take 400 mg by mouth every 8 (eight) hours as needed for mild pain or moderate pain.    Yes Historical Provider, MD  lisinopril (PRINIVIL,ZESTRIL) 10 MG tablet Take 1 tablet (10 mg total) by mouth daily. 10/05/15  Yes Larey Dresser, MD  loratadine (CLARITIN) 10 MG tablet Take 10 mg by mouth daily as needed for allergies.   Yes Historical Provider, MD  Multiple Vitamin (ONE-A-DAY 55 PLUS PO) Take 1 tablet by mouth at bedtime.   Yes Historical Provider, MD  rosuvastatin (CRESTOR) 10 MG tablet Take 1 tablet (10 mg total) by mouth daily. 10/05/15  Yes Larey Dresser, MD  tamsulosin (FLOMAX) 0.4 MG CAPS capsule Take 0.4 mg by mouth at bedtime.   Yes Historical Provider, MD  tiotropium (SPIRIVA HANDIHALER) 18 MCG inhalation capsule INHALE THE CONTENTS OF 1  CAPSULE VIA HANDIHALER  DAILY. 12/01/15  Yes Brand Males, MD  UNABLE TO FIND Cocoa extract: Take 2 capsules by mouth nightly   Yes Historical Provider, MD    Family History  Family History  Problem Relation Age of Onset  . Emphysema Father   . Cancer Mother   . Coronary artery disease Neg Hx   . Colon cancer Neg Hx   . Esophageal cancer Neg Hx   . Rectal cancer Neg Hx   . Stomach cancer Neg Hx    Family Status  Relation Status  . Father Deceased  . Mother Deceased  . Neg Hx      Social History  Social History   Social History  . Marital status: Married    Spouse name: N/A  . Number of children: N/A  . Years of education: N/A   Occupational History  . retired Sports coach Employed   Social History Main Topics  . Smoking status: Former Smoker    Packs/day: 1.00    Years: 25.00    Types: Cigarettes    Quit date: 07/03/1982  . Smokeless tobacco: Never Used  . Alcohol use 7.0 oz/week    14 Standard drinks or equivalent per week  . Drug use: No  . Sexual activity: Not on file   Other Topics Concern  . Not on file   Social History Narrative  . No narrative on file     All other systems reviewed and are  otherwise negative except as noted above.  Physical Exam  Blood pressure 122/84, pulse 86, temperature 98.4 F (36.9 C), temperature source Oral, resp. rate 11, height 5\' 10"  (1.778 m), weight 188 lb (85.3 kg), SpO2 98 %.  General: Pleasant, NAD Psych: Normal affect. Neuro: Alert and oriented X 3. Moves all extremities spontaneously. HEENT: Normal  Neck: Supple without bruits or JVD. Lungs:  Resp regular and unlabored, CTA. Heart: RRR no s3, s4, or murmurs. Abdomen: Soft, non-tender, non-distended, BS + x 4.  Extremities: No clubbing, cyanosis. Trace BL LE edema L > R.  DP/PT/Radials 2+ and equal bilaterally.  Labs  No results for input(s): CKTOTAL, CKMB, TROPONINI in the last 72 hours. Lab Results  Component Value Date   WBC 8.1 01/24/2016   HGB 14.9 01/24/2016   HCT 43.6 01/24/2016   MCV 92.4 01/24/2016   PLT 220 01/24/2016    Recent Labs Lab 01/24/16 1431  NA 136  K 4.5  CL 105  CO2 24  BUN 17  CREATININE 1.43*  CALCIUM 9.0  GLUCOSE 103*   Lab Results  Component Value Date   CHOL 153 11/03/2014   HDL 58.70 11/03/2014   LDLCALC 78 11/03/2014   TRIG 81.0 11/03/2014   No results found for: DDIMER   Radiology/Studies  Dg Chest 2 View  Result Date: 01/24/2016 CLINICAL DATA:  Central chest pain radiating to left side of the chest. EXAM: CHEST  2 VIEW COMPARISON:  CT of the chest 12/09/2015 FINDINGS: Cardiomediastinal silhouette is normal. Mediastinal contours appear intact. Tortuosity and calcified atherosclerotic disease of the aorta are noted. There is no evidence of focal airspace consolidation, pleural effusion or pneumothorax. Osseous structures are without acute abnormality. Soft tissues are grossly normal. IMPRESSION: No active cardiopulmonary disease. Atherosclerotic disease of the aorta. Electronically Signed   By: Fidela Salisbury M.D.   On: 01/24/2016 15:19   ECG  Sinus rhythm with PACs Vent. rate 75 BPM PR interval * ms QRS duration 101  ms QT/QTc 377/421 ms P-R-T axes 46 -12 23  ASSESSMENT AND PLAN  1. Chest pain - Concerning for unstable angina.  Responsive to SL nitro. POC troponin. EKG without acute changes. Cycle troponin and serial EKG.  Stable coronary artery calcification on recent CT of chest 12/09/15. Start IV heparin and cath tomorrow.   2. HLD - LDL 78 11/03/14. Will repeat lab. Continue current regimen.   3. HTN - Stable now. Continue Norvasc. Hold Lisinopril for now due to AKI and cath tomorrow.   4. AKI - Given 100 cc of fluid. SCr 1.43 with GFR of 47. No prior hx of kidney disease.  Follow closely.   5. Coronary atherosclerosis denoted by coronary calcification on previous chest CT. There is also evidence of diffuse aortic atherosclerosis on chest exam.  Signed, Bhagat,Bhavinkumar, PA-C 01/24/2016, 4:38 PM Pager [phone]  The patient has been seen in conjunction with Vin Bhagat, PAC. All aspects of care have been considered and discussed. The patient has been personally interviewed, examined, and all clinical data has been reviewed.   A 75 year old [place] and patient of Dr. Einar Crow, with prior CT documented coronary calcification and generous aortic atherosclerosis by chest x-ray presenting with 30-40 minutes of significant substernal chest tightness with a waxing and waning nature. Now totally pain-free. He will be moving to [place] in the very near future to assume responsibilities for [employer].  Other medical problems include hypertension, hyperlipidemia, CKD, and emphysema. Prior smoker rated than 25-pack-years.  Exam is unremarkable. No carotid bruits. S4 gallop on auscultation. EKG reveals incomplete right bundle. No change compared to tracings from 3 years ago. 2016 CT scan of the chest demonstrated three-vessel coronary calcification including involvement of the left main.  New onset angina pectoris/unstable angina.  Given clinical evidence of coronary artery disease by previous CT,  coronary angiography is indicated to define anatomy and help guide therapy in this situation. IV heparin will be started. IV nitroglycerin will be started if recurrent chest pain. Serial cardiac markers will be done to exclude infarction. Angiography will be performed tomorrow. The patient was  counseled to undergo left heart catheterization, coronary angiography, and possible percutaneous coronary intervention with stent implantation. The procedural risks and benefits were discussed in detail. The risks discussed included death, stroke, myocardial infarction, life-threatening bleeding, limb ischemia, kidney injury, allergy, and possible emergency cardiac surgery. The risk of these significant complications were estimated to occur less than 1% of the time. After discussion, the patient has agreed to proceed.

## 2016-01-24 NOTE — ED Triage Notes (Signed)
Per EMS pt from home was walking dog and had sudden onset of substernal non-radiating cp rated 2-3/10. Patient started walking home and pain subsided. Upon arrival at home CP returned rated 3-/10 and radiated to left shoulder. Pt took 324 ASA at home. Upon EMS arrival patient noted lightheadedness with chest pain. EMS administered 2 SL nitroglycerin with complete relief of CP. Patient nect BP was 84/44 after administration of nitro. Patient given 100cc of NS IV and BP returned to [id number] systolic. Patient denies complaints at present time.

## 2016-01-25 ENCOUNTER — Encounter (HOSPITAL_COMMUNITY)
Admission: EM | Disposition: A | Discharge status: Home / Self Care | Payer: Self-pay | Source: Home / Self Care | Attending: Emergency Medicine

## 2016-01-25 ENCOUNTER — Encounter: Payer: Self-pay | Admitting: Cardiology

## 2016-01-25 ENCOUNTER — Encounter (HOSPITAL_COMMUNITY): Payer: Self-pay | Admitting: Physician Assistant

## 2016-01-25 DIAGNOSIS — I200 Unstable angina: Secondary | ICD-10-CM | POA: Diagnosis not present

## 2016-01-25 DIAGNOSIS — D150 Benign neoplasm of thymus: Secondary | ICD-10-CM | POA: Diagnosis not present

## 2016-01-25 DIAGNOSIS — E785 Hyperlipidemia, unspecified: Secondary | ICD-10-CM | POA: Diagnosis not present

## 2016-01-25 DIAGNOSIS — I2510 Atherosclerotic heart disease of native coronary artery without angina pectoris: Secondary | ICD-10-CM | POA: Diagnosis present

## 2016-01-25 DIAGNOSIS — I129 Hypertensive chronic kidney disease with stage 1 through stage 4 chronic kidney disease, or unspecified chronic kidney disease: Secondary | ICD-10-CM | POA: Diagnosis not present

## 2016-01-25 DIAGNOSIS — N179 Acute kidney failure, unspecified: Secondary | ICD-10-CM | POA: Diagnosis not present

## 2016-01-25 DIAGNOSIS — J410 Simple chronic bronchitis: Secondary | ICD-10-CM

## 2016-01-25 DIAGNOSIS — K219 Gastro-esophageal reflux disease without esophagitis: Secondary | ICD-10-CM | POA: Diagnosis not present

## 2016-01-25 DIAGNOSIS — J449 Chronic obstructive pulmonary disease, unspecified: Secondary | ICD-10-CM | POA: Diagnosis not present

## 2016-01-25 DIAGNOSIS — F419 Anxiety disorder, unspecified: Secondary | ICD-10-CM | POA: Diagnosis not present

## 2016-01-25 DIAGNOSIS — I10 Essential (primary) hypertension: Secondary | ICD-10-CM | POA: Diagnosis not present

## 2016-01-25 DIAGNOSIS — I700 Atherosclerosis of aorta: Secondary | ICD-10-CM | POA: Diagnosis not present

## 2016-01-25 DIAGNOSIS — I209 Angina pectoris, unspecified: Secondary | ICD-10-CM

## 2016-01-25 DIAGNOSIS — I25110 Atherosclerotic heart disease of native coronary artery with unstable angina pectoris: Secondary | ICD-10-CM | POA: Diagnosis not present

## 2016-01-25 DIAGNOSIS — N189 Chronic kidney disease, unspecified: Secondary | ICD-10-CM | POA: Diagnosis present

## 2016-01-25 DIAGNOSIS — Z87891 Personal history of nicotine dependence: Secondary | ICD-10-CM | POA: Diagnosis not present

## 2016-01-25 DIAGNOSIS — I25810 Atherosclerosis of coronary artery bypass graft(s) without angina pectoris: Secondary | ICD-10-CM

## 2016-01-25 DIAGNOSIS — I2584 Coronary atherosclerosis due to calcified coronary lesion: Secondary | ICD-10-CM | POA: Diagnosis not present

## 2016-01-25 DIAGNOSIS — Z7982 Long term (current) use of aspirin: Secondary | ICD-10-CM | POA: Diagnosis not present

## 2016-01-25 HISTORY — PX: CARDIAC CATHETERIZATION: SHX172

## 2016-01-25 LAB — LIPID PANEL
Cholesterol: 136 mg/dL (ref 0–200)
HDL: 56 mg/dL (ref >40)
LDL CALC: 72 mg/dL (ref 0–99)
TRIGLYCERIDES: 38 mg/dL (ref <150)
Total CHOL/HDL Ratio: 2.4 RATIO
VLDL: 8 mg/dL (ref 0–40)

## 2016-01-25 LAB — BASIC METABOLIC PANEL
ANION GAP: 6 (ref 5–15)
BUN: 12 mg/dL (ref 6–20)
CALCIUM: 8.4 mg/dL — AB (ref 8.9–10.3)
CO2: 23 mmol/L (ref 22–32)
CREATININE: 1.38 mg/dL — AB (ref 0.61–1.24)
Chloride: 108 mmol/L (ref 101–111)
GFR, EST AFRICAN AMERICAN: 57 mL/min — AB (ref >60)
GFR, EST NON AFRICAN AMERICAN: 50 mL/min — AB (ref >60)
GLUCOSE: 100 mg/dL — AB (ref 65–99)
Potassium: 4.1 mmol/L (ref 3.5–5.1)
Sodium: 137 mmol/L (ref 135–145)

## 2016-01-25 LAB — CBC
HEMATOCRIT: 39.6 % (ref 39.0–52.0)
HEMOGLOBIN: 13.5 g/dL (ref 13.0–17.0)
MCH: 31.5 pg (ref 26.0–34.0)
MCHC: 34.1 g/dL (ref 30.0–36.0)
MCV: 92.3 fL (ref 78.0–100.0)
Platelets: 219 10*3/uL (ref 150–400)
RBC: 4.29 MIL/uL (ref 4.22–5.81)
RDW: 13.3 % (ref 11.5–15.5)
WBC: 7.5 10*3/uL (ref 4.0–10.5)

## 2016-01-25 LAB — TROPONIN I

## 2016-01-25 LAB — PROTIME-INR
INR: 1.22 (ref 0.00–1.49)
PROTHROMBIN TIME: 15.6 s — AB (ref 11.6–15.2)

## 2016-01-25 LAB — HEPARIN LEVEL (UNFRACTIONATED): Heparin Unfractionated: 0.79 IU/mL — ABNORMAL HIGH (ref 0.30–0.70)

## 2016-01-25 SURGERY — LEFT HEART CATH AND CORONARY ANGIOGRAPHY

## 2016-01-25 MED ORDER — VERAPAMIL HCL 2.5 MG/ML IV SOLN
INTRAVENOUS | Status: AC
  Filled 2016-01-25: qty 2

## 2016-01-25 MED ORDER — IOPAMIDOL (ISOVUE-370) INJECTION 76%
INTRAVENOUS | Status: DC | PRN
  Administered 2016-01-25: 70 mL via INTRA_ARTERIAL

## 2016-01-25 MED ORDER — HEPARIN SODIUM (PORCINE) 1000 UNIT/ML IJ SOLN
INTRAMUSCULAR | Status: AC
  Filled 2016-01-25: qty 1

## 2016-01-25 MED ORDER — FENTANYL CITRATE (PF) 100 MCG/2ML IJ SOLN
INTRAMUSCULAR | Status: DC | PRN
  Administered 2016-01-25 (×2): 25 ug via INTRAVENOUS

## 2016-01-25 MED ORDER — DIAZEPAM 5 MG PO TABS: 5.0000 mg | ORAL_TABLET | ORAL | Status: DC | PRN

## 2016-01-25 MED ORDER — HEPARIN SODIUM (PORCINE) 1000 UNIT/ML IJ SOLN
INTRAMUSCULAR | Status: DC | PRN
  Administered 2016-01-25: 4000 [IU] via INTRAVENOUS

## 2016-01-25 MED ORDER — VERAPAMIL HCL 2.5 MG/ML IV SOLN
INTRAVENOUS | Status: DC | PRN
  Administered 2016-01-25: 10 mL via INTRA_ARTERIAL

## 2016-01-25 MED ORDER — ACETAMINOPHEN 325 MG PO TABS: 650.0000 mg | ORAL_TABLET | ORAL | Status: DC | PRN

## 2016-01-25 MED ORDER — MIDAZOLAM HCL 2 MG/2ML IJ SOLN
INTRAMUSCULAR | Status: DC | PRN
  Administered 2016-01-25: 1 mg via INTRAVENOUS
  Administered 2016-01-25: 2 mg via INTRAVENOUS

## 2016-01-25 MED ORDER — MIDAZOLAM HCL 2 MG/2ML IJ SOLN
INTRAMUSCULAR | Status: AC
  Filled 2016-01-25: qty 2

## 2016-01-25 MED ORDER — SODIUM CHLORIDE 0.9 % WEIGHT BASED INFUSION
3.0000 mL/kg/h | INTRAVENOUS | Status: AC
  Administered 2016-01-25: 3 mL/kg/h via INTRAVENOUS

## 2016-01-25 MED ORDER — HEPARIN (PORCINE) IN NACL 2-0.9 UNIT/ML-% IJ SOLN
INTRAMUSCULAR | Status: AC
  Filled 2016-01-25: qty 1000

## 2016-01-25 MED ORDER — ONDANSETRON HCL 4 MG/2ML IJ SOLN: 4.0000 mg | Freq: Four times a day (QID) | INTRAMUSCULAR | Status: DC | PRN

## 2016-01-25 MED ORDER — IOPAMIDOL (ISOVUE-370) INJECTION 76%
INTRAVENOUS | Status: AC
  Filled 2016-01-25: qty 100

## 2016-01-25 MED ORDER — FENTANYL CITRATE (PF) 100 MCG/2ML IJ SOLN
INTRAMUSCULAR | Status: AC
  Filled 2016-01-25: qty 2

## 2016-01-25 MED ORDER — LIDOCAINE HCL (PF) 1 % IJ SOLN
INTRAMUSCULAR | Status: AC
  Filled 2016-01-25: qty 30

## 2016-01-25 MED ORDER — HEPARIN (PORCINE) IN NACL 2-0.9 UNIT/ML-% IJ SOLN
INTRAMUSCULAR | Status: DC | PRN
  Administered 2016-01-25: 1000 mL

## 2016-01-25 MED ORDER — LIDOCAINE HCL (PF) 1 % IJ SOLN
INTRAMUSCULAR | Status: DC | PRN
  Administered 2016-01-25: 2 mL via INTRADERMAL

## 2016-01-25 MED ORDER — SODIUM CHLORIDE 0.9 % IV SOLN: 250.0000 mL | INTRAVENOUS | Status: DC | PRN

## 2016-01-25 MED ORDER — SODIUM CHLORIDE 0.9% FLUSH: 3.0000 mL | INTRAVENOUS | Status: DC | PRN

## 2016-01-25 MED ORDER — SODIUM CHLORIDE 0.9% FLUSH: 3.0000 mL | Freq: Two times a day (BID) | INTRAVENOUS | Status: DC

## 2016-01-25 SURGICAL SUPPLY — 12 items
CATH INFINITI 5 FR JL3.5 (CATHETERS) ×3 IMPLANT
CATH INFINITI 5FR ANG PIGTAIL (CATHETERS) ×3 IMPLANT
CATH INFINITI JR4 5F (CATHETERS) IMPLANT
CATH OPTITORQUE TIG 4.0 5F (CATHETERS) ×3 IMPLANT
DEVICE RAD COMP TR BAND LRG (VASCULAR PRODUCTS) ×3 IMPLANT
GLIDESHEATH SLEND SS 6F .021 (SHEATH) ×3 IMPLANT
KIT HEART LEFT (KITS) ×3 IMPLANT
PACK CARDIAC CATHETERIZATION (CUSTOM PROCEDURE TRAY) ×3 IMPLANT
SYR MEDRAD MARK V 150ML (SYRINGE) ×3 IMPLANT
TRANSDUCER W/STOPCOCK (MISCELLANEOUS) ×3 IMPLANT
TUBING CIL FLEX 10 FLL-RA (TUBING) ×3 IMPLANT
WIRE SAFE-T 1.5MM-J .035X260CM (WIRE) ×3 IMPLANT

## 2016-01-25 NOTE — Progress Notes (Addendum)
Pt given instructions & education on radial site care. Allan's test performed. VS stable & charted. Radial site a level 0.  Hoover Brunette, RN

## 2016-01-25 NOTE — Progress Notes (Signed)
RN offered to set-up cath video for patient to watch.  Patient stated he did not want to watch it.

## 2016-01-25 NOTE — Discharge Instructions (Signed)
Radial Site Care Refer to this sheet in the next few weeks. These instructions provide you with information on caring for yourself after your procedure. Your caregiver may also give you more specific instructions. Your treatment has been planned according to current medical practices, but problems sometimes occur. Call your caregiver if you have any problems or questions after your procedure. HOME CARE INSTRUCTIONS  You may shower the day after the procedure.Remove the bandage (dressing) and gently wash the site with plain soap and water.Gently pat the site dry.   Do not apply powder or lotion to the site.   Do not submerge the affected site in water for 3 to 5 days.   Inspect the site at least twice daily.   Do not flex or bend the affected arm for 24 hours.   No lifting over 5 pounds (2.3 kg) for 5 days after your procedure.   Do not drive home if you are discharged the same day of the procedure. Have someone else drive you.   You may drive 24 hours after the procedure unless otherwise instructed by your caregiver.  What to expect:  Any bruising will usually fade within 1 to 2 weeks.   Blood that collects in the tissue (hematoma) may be painful to the touch. It should usually decrease in size and tenderness within 1 to 2 weeks.  SEEK IMMEDIATE MEDICAL CARE IF:  You have unusual pain at the radial site.   You have redness, warmth, swelling, or pain at the radial site.   You have drainage (other than a small amount of blood on the dressing).   You have chills.   You have a fever or persistent symptoms for more than 72 hours.   You have a fever and your symptoms suddenly get worse.   Your arm becomes pale, cool, tingly, or numb.   You have heavy bleeding from the site. Hold pressure on the site.     Please avoid using NSAIDS such as ibuprofen or motrin as it can damage your kidneys.

## 2016-01-25 NOTE — Progress Notes (Signed)
ANTICOAGULATION CONSULT NOTE - Follow-up Consult  Pharmacy Consult for Heparin Indication: chest pain/ACS  No Known Allergies  Patient Measurements: Height: 5\' 10"  (177.8 cm) Weight: 182 lb 4.8 oz (82.7 kg) IBW/kg (Calculated) : 73 Heparin Dosing Weight: 83 kg  Vital Signs: Temp: 97.7 F (36.5 C) (07/24 2007) Temp Source: Oral (07/24 2007) BP: 152/81 (07/24 2015) Pulse Rate: 85 (07/24 2005)  Labs:  Recent Labs  01/24/16 1431 01/24/16 2040 01/25/16 0303 01/25/16 0446  HGB 14.9  --   --   --   HCT 43.6  --   --   --   PLT 220  --   --   --   HEPARINUNFRC  --   --   --  0.79*  CREATININE 1.43*  --   --   --   TROPONINI  --  <0.03 <0.03  --     Estimated Creatinine Clearance: 48.2 mL/min (by C-G formula based on SCr of 1.43 mg/dL).   Assessment: 75yo male on heparin for ACS/chest pain. Heparin level supratherapeutic (0.79) on 1150 units/hr. No issues with line or bleeding reported per RN.  Goal of Therapy:  Heparin level 0.3-0.7 units/ml Monitor platelets by anticoagulation protocol: Yes   Plan:  Decrease heparin to 1050 units/hr F/u 8hr heparin level  Sherlon Handing, PharmD, BCPS Clinical pharmacist, pager [phone] 01/25/2016,5:46 AM

## 2016-01-25 NOTE — Discharge Summary (Signed)
Discharge Summary    Patient ID: Michael [name],  MRN: [id number], DOB/AGE: [birth year] 75 y.o.  Admit date: 01/24/2016 Discharge date: 01/25/2016  Primary Care Provider: Donnajean Lopes Primary Cardiologist: Dr. Aundra Dubin (Non CHF)   Discharge Diagnoses    Principal Problem:   Chest pain Active Problems:   Hyperlipidemia   HYPERTENSION, BENIGN   DIZZINESS   COPD (chronic obstructive pulmonary disease) (HCC)   Thymoma, benign   CAD (coronary artery disease)   GERD (gastroesophageal reflux disease)   CKD (chronic kidney disease)   Allergies No Known Allergies   History of Present Illness     Michael [name] is a 75 y.o. 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   09/2010, he had an ETT-myoview for atypical chest pain that showed no evidence for ischemia or infarction. Chronic SOB due to COPD is stable. He was doing well on cardiac stand point of view when seen last by Dr. Aundra Dubin 08/06/14.  He was in [place] until 01/24/16 when he has episode of substernal chest tightness while playing in with dog at store. He went home again had chest tightness while walking with dog. EMS was called and his tightness resolved after SL nitro x 2. Given 100cc of NS due to BP of 84/44. Scr of 1.43. POC troponin negative. CXR with out active disease. Atherosclerotic disease of the aorta. EKG showed sinus rhythm with PVCs at rate of 75 bpm, no acute changes.   Of note, 2016 CT scan of the chest demonstrated three-vessel coronary calcification including involvement of the left main and and generous aortic atherosclerosis noted by chest x-ray this admission. Due to these high risk findings, he was admitted for [place], placed on heparin gtt and set up for coronary angiography.    Hospital Course       Consultants: none  He ruled out for MI with serial negative cardiac enzymes and non acute ECG. He remained chest pain free and underwent [place] today (01/25/16) which showed normal epicardial coronary arteries, normal LV function and no valvular abnormalities. He will be moving to [place] in the near future for work. He can follow up with Dr. Aundra Dubin as previously scheduled.    The patient has had an uncomplicated hospital course and is recovering well. The radial catheter site is stable. He has been seen by Dr. Tamala Julian today and deemed ready for discharge home. Discharge medications are listed below.  _____________  Discharge Vitals Blood pressure 98/71, pulse 62, temperature 98 F (36.7 C), temperature source Oral, resp. rate 18, height 5\' 10"  (1.778 m), weight 182 lb 3.2 oz (82.6 kg), SpO2 95 %.  Filed Weights   01/24/16 1428 01/24/16 2000 01/25/16 0500  Weight: 188 lb (85.3 kg) 182 lb 4.8 oz (82.7 kg) 182 lb 3.2 oz (82.6 kg)    Labs & Radiologic Studies     CBC  Recent Labs  01/24/16 1431 01/25/16 0712  WBC 8.1 7.5  HGB 14.9 13.5  HCT 43.6 39.6  MCV 92.4 92.3  PLT 220 [id number]   Basic Metabolic Panel  Recent Labs  01/24/16 1431 01/25/16 0712  NA 136 137  K 4.5 4.1  CL 105 108  CO2 24 23  GLUCOSE 103* 100*  BUN 17 12  CREATININE 1.43* 1.38*  CALCIUM 9.0 8.4*  Liver Function Tests No results for input(s): AST, ALT, ALKPHOS, BILITOT, PROT, ALBUMIN in the last 72 hours. No results for input(s): LIPASE, AMYLASE in the last 72 hours. Cardiac Enzymes  Recent Labs  01/24/16 2040 01/25/16 0303 01/25/16 0712  TROPONINI <0.03 <0.03 <0.03   Fasting Lipid Panel  Recent Labs  01/25/16 0446  CHOL 136  HDL 56  LDLCALC 72  TRIG 38  CHOLHDL 2.4   Thyroid Function Tests  Recent Labs  01/24/16 2040  TSH 2.268    Dg Chest 2 View  Result Date: 01/24/2016 CLINICAL DATA:  Central chest pain radiating to left side of the chest. EXAM: CHEST  2 VIEW COMPARISON:  CT of  the chest 12/09/2015 FINDINGS: Cardiomediastinal silhouette is normal. Mediastinal contours appear intact. Tortuosity and calcified atherosclerotic disease of the aorta are noted. There is no evidence of focal airspace consolidation, pleural effusion or pneumothorax. Osseous structures are without acute abnormality. Soft tissues are grossly normal. IMPRESSION: No active cardiopulmonary disease. Atherosclerotic disease of the aorta. Electronically Signed   By: Fidela Salisbury M.D.   On: 01/24/2016 15:19    Diagnostic Studies/Procedures    LHC 01/25/16 Conclusion   The left ventricular systolic function is normal.  LV end diastolic pressure is normal.  The left ventricular ejection fraction is 50-55% by visual estimate.  There is no mitral valve regurgitation.  There is no aortic valve stenosis.  There is no mitral valve regurgitation.   Normal global LV function with an ejection fraction of 50-55%.  Normal epicardial coronary arteries.    _____________    Disposition   Pt is being discharged home today in good condition.  Follow-up Plans & Appointments    Follow-up Information    Loralie Champagne, MD Follow up on 03/13/2016.   Specialty:  Cardiology Why:  @ 3:45pm Contact information: [address] [place] [address] [phone]            Discharge Medications     Medication List    STOP taking these medications   ibuprofen 200 MG tablet Commonly known as:  ADVIL,MOTRIN     TAKE these medications   ALPRAZolam 0.25 MG tablet Commonly known as:  XANAX Take 0.25 mg by mouth daily as needed. For anxiety   amLODipine 5 MG tablet Commonly known as:  NORVASC Take 5 mg tablet by mouth in the AM and 2.5 mg tablet by mouth in the PM. What changed:  how much to take  how to take this  when to take this  additional instructions   aspirin EC 81 MG tablet Take 81 mg by mouth every evening.   CALCIUM + D PO Take 1 tablet by  mouth daily.   [employer] COENZYME Q-10 100 MG capsule Generic drug:  Coenzyme Q10 Take 1 capsule by mouth daily.   FLOMAX 0.4 MG Caps capsule Generic drug:  tamsulosin Take 0.4 mg by mouth at bedtime.   fluticasone 50 MCG/ACT nasal spray Commonly known as:  FLONASE Place 2 sprays into both nostrils daily. What changed:  when to take this  reasons to take this   lisinopril 10 MG tablet Commonly known as:  PRINIVIL,ZESTRIL Take 1 tablet (10 mg total) by mouth daily.   loratadine 10 MG tablet Commonly known as:  CLARITIN Take 10 mg by mouth daily as needed for allergies.   ONE-A-DAY 55 PLUS PO Take 1 tablet by mouth at bedtime.   rosuvastatin 10 MG tablet Commonly known as:  CRESTOR Take  1 tablet (10 mg total) by mouth daily.   tiotropium 18 MCG inhalation capsule Commonly known as:  SPIRIVA HANDIHALER INHALE THE CONTENTS OF 1  CAPSULE VIA HANDIHALER  DAILY.   UNABLE TO FIND Cocoa extract: Take 2 capsules by mouth nightly         Outstanding Labs/Studies   None.   Duration of Discharge Encounter   Greater than 30 minutes including physician time.  Signed, Angelena Form PA-C 01/25/2016, 12:14 PM The patient has been seen in conjunction with Nell Range, PAC. All aspects of care have been considered and discussed. The patient has been personally interviewed, examined, and all clinical data has been reviewed.   The patient presented with symptoms compatible with angina pectoris in the setting of chronic coronary disease did noted by calcification on multiple prior chest CT studies.  Coronary angiography was performed as a definitive test to evaluate for the presence of obstructive disease. Likely for the patient no significant abnormalities were found.  Procedural site is unremarkable for evidence of complications. Kidney function is stable.  Patient is stable for discharge and will follow-up with Dr. Aundra Dubin.

## 2016-01-25 NOTE — Plan of Care (Signed)
Problem: Education: Goal: Knowledge of [facility] Education information/materials will improve Outcome: Progressing Patient aware of plan of care.  RN and patient reviewed all medications administered thus far this shift.  Patient stated understanding.  Patient has denied pain since arriving on unit.  RN instructed patient to notify staff immediately if he started to experience any pain/discomfort.  Patient stated understanding.  RN instructed patient to call and wait for staff assistance prior to ambulation due to tethering equipment.  Patient stated understanding and has made no attempts to get out of bed unassisted.  Consent for cardiac catheterization filled out per RN per MD order.  Patient did not want to sign at this time due to Dr. Claiborne Billings being listed as the MD completing procedure and per patient not familiar with this MD.  RN will pass this information along to day shift RN and cath lab RN.  RN explained to patient that cath lab RN might have an explanation as to why Dr. Claiborne Billings is listed as the procedure MD versus MD patient is familiar with.

## 2016-01-25 NOTE — Plan of Care (Signed)
Problem: Consults Goal: Skin Care Protocol Initiated - if Braden Score 18 or less If consults are not indicated, leave blank or document N/A  Outcome: Not Applicable Date Met: 31/51/76 braden > 18 Goal: Tobacco Cessation referral if indicated Outcome: Not Applicable Date Met: 16/07/37 Pt does not smoke. Goal: Diabetes Guidelines if Diabetic/Glucose > 140 If diabetic or lab glucose is > 140 mg/dl - Initiate Diabetes/Hyperglycemia Guidelines & Document Interventions   Outcome: Not Applicable Date Met: 10/62/69 Pt is not diabetic

## 2016-01-25 NOTE — Progress Notes (Signed)
       Patient Name: Michael [name] Date of Encounter: 01/25/2016    SUBJECTIVE: Quiet night apparently without recurrent chest pain  TELEMETRY:  Normal sinus rhythm: Vitals:   01/24/16 2005 01/24/16 2007 01/24/16 2015 01/25/16 0500  BP:   (!) 152/81 111/69  Pulse: 85   65  Resp: 18   18  Temp:  97.7 F (36.5 C)  98 F (36.7 C)  TempSrc:  Oral  Oral  SpO2: 99%   96%  Weight:    182 lb 3.2 oz (82.6 kg)  Height:        Intake/Output Summary (Last 24 hours) at 01/25/16 0742 Last data filed at 01/25/16 0700  Gross per 24 hour  Intake          1531.23 ml  Output              775 ml  Net           756.23 ml   LABS: Basic Metabolic Panel:  Recent Labs  01/24/16 1431  NA 136  K 4.5  CL 105  CO2 24  GLUCOSE 103*  BUN 17  CREATININE 1.43*  CALCIUM 9.0   CBC:  Recent Labs  01/24/16 1431  WBC 8.1  HGB 14.9  HCT 43.6  MCV 92.4  PLT 220   Cardiac Enzymes:  Recent Labs  01/24/16 2040 01/25/16 0303  TROPONINI <0.03 <0.03    Fasting Lipid Panel:  Recent Labs  01/25/16 0446  CHOL 136  HDL 56  LDLCALC 72  TRIG 38  CHOLHDL 2.4    Radiology/Studies:  Chest x-ray demonstrates aortic atherosclerosis  Physical Exam: Blood pressure 111/69, pulse 65, temperature 98 F (36.7 C), temperature source Oral, resp. rate 18, height 5\' 10"  (1.778 m), weight 182 lb 3.2 oz (82.6 kg), SpO2 96 %. Weight change:   Wt Readings from Last 3 Encounters:  01/25/16 182 lb 3.2 oz (82.6 kg)  12/01/15 188 lb (85.3 kg)  03/03/15 192 lb (87.1 kg)    Unchanged from admission  ASSESSMENT:  1. Prolonged chest pain compatible with new onset angina. No evidence of infarction.  Plan:  Coronary angiography this morning to define anatomy and help guide therapy. Infarction/ischemia has not been localized and therefore borderline lesions will need hemodynamic testing prior to intervention.  Signed, Belva Crome III 01/25/2016, 7:42 AM

## 2016-01-25 NOTE — Interval H&P Note (Signed)
Cath Lab Visit (complete for each Cath Lab visit)  Clinical Evaluation Leading to the Procedure:   ACS: No.  Non-ACS:    Anginal Classification: CCS III  Anti-ischemic medical therapy: Minimal Therapy (1 class of medications)  Non-Invasive Test Results: No non-invasive testing performed  Prior CABG: No previous CABG      History and Physical Interval Note:  01/25/2016 7:40 AM  Michael [name]  has presented today for surgery, with the diagnosis of cp  The various methods of treatment have been discussed with the patient and family. After consideration of risks, benefits and other options for treatment, the patient has consented to  Procedure(s): Left Heart Cath and Coronary Angiography (N/A) as a surgical intervention .  The patient's history has been reviewed, patient examined, no change in status, stable for surgery.  I have reviewed the patient's chart and labs.  Questions were answered to the patient's satisfaction.     Shelva Majestic

## 2016-01-25 NOTE — Progress Notes (Addendum)
Pt given discharge instructions & education. Pt educated on radial site care, follow-up appointments &  When to call the MD. Pt's IVs were removed & he tolerated it well. Pt ambulated without any issues of sob or bleeding.  Hoover Brunette, RN

## 2016-01-26 LAB — HEMOGLOBIN A1C
HEMOGLOBIN A1C: 5.2 % (ref 4.8–5.6)
MEAN PLASMA GLUCOSE: 103 mg/dL

## 2016-02-02 ENCOUNTER — Encounter: Payer: Self-pay | Admitting: Cardiology

## 2016-02-18 ENCOUNTER — Encounter: Payer: Self-pay | Admitting: Cardiology

## 2016-02-22 DIAGNOSIS — D1801 Hemangioma of skin and subcutaneous tissue: Secondary | ICD-10-CM | POA: Diagnosis not present

## 2016-02-22 DIAGNOSIS — L814 Other melanin hyperpigmentation: Secondary | ICD-10-CM | POA: Diagnosis not present

## 2016-02-22 DIAGNOSIS — L300 Nummular dermatitis: Secondary | ICD-10-CM | POA: Diagnosis not present

## 2016-02-22 DIAGNOSIS — L570 Actinic keratosis: Secondary | ICD-10-CM | POA: Diagnosis not present

## 2016-02-22 DIAGNOSIS — L821 Other seborrheic keratosis: Secondary | ICD-10-CM | POA: Diagnosis not present

## 2016-03-03 ENCOUNTER — Encounter: Payer: Self-pay | Admitting: Cardiology

## 2016-03-03 ENCOUNTER — Ambulatory Visit (INDEPENDENT_AMBULATORY_CARE_PROVIDER_SITE_OTHER): Payer: Medicare Other | Admitting: Cardiology

## 2016-03-03 VITALS — BP 122/80 | HR 80 | Ht 70.0 in | Wt 184.0 lb

## 2016-03-03 DIAGNOSIS — R0789 Other chest pain: Secondary | ICD-10-CM | POA: Diagnosis not present

## 2016-03-03 DIAGNOSIS — E785 Hyperlipidemia, unspecified: Secondary | ICD-10-CM

## 2016-03-03 DIAGNOSIS — I10 Essential (primary) hypertension: Secondary | ICD-10-CM | POA: Diagnosis not present

## 2016-03-03 MED ORDER — ASPIRIN EC 81 MG PO TBEC: 81.0000 mg | DELAYED_RELEASE_TABLET | Freq: Every evening | ORAL | 3 refills | Status: AC

## 2016-03-03 MED ORDER — ROSUVASTATIN CALCIUM 10 MG PO TABS: 10.0000 mg | ORAL_TABLET | Freq: Every day | ORAL | 3 refills | Status: AC

## 2016-03-03 MED ORDER — LISINOPRIL 10 MG PO TABS: 10.0000 mg | ORAL_TABLET | Freq: Every day | ORAL | 3 refills | Status: AC

## 2016-03-03 MED ORDER — AMLODIPINE BESYLATE 5 MG PO TABS: 5.0000 mg | ORAL_TABLET | Freq: Every day | ORAL | 3 refills | Status: AC

## 2016-03-03 NOTE — Patient Instructions (Addendum)
Medication Instructions:  Your physician recommends that you continue on your current medications as directed. Please refer to the Current Medication list given to you today.  Labwork: None ordered  Testing/Procedures: None ordered  Follow-Up: Your physician wants you to follow-up in: 1 year with Dr. Aundra Dubin in the [facility].  You will receive a reminder letter in the mail two months in advance. If you don't receive a letter, please call our office to schedule the follow-up appointment.  If you need a refill on your cardiac medications before your next appointment, please call your pharmacy.  Thank you for choosing [facility]!!

## 2016-03-03 NOTE — Progress Notes (Signed)
Patient ID: Michael [name], male   DOB: 07/04/1940, 74 y.o.   MRN: [id number] PCP: Dr. Philip Aspen  75 yo with history of HTN, hyperlipidemia, COPD, and spells of lightheadedness presents for cardiology followup. In 3/12, he had an ETT-myoview for atypical chest pain that showed no evidence for ischemia or infarction. He has mild exertional dyspnea, better now that he takes Spiriva for his COPD.  He had an episode of severe chest pain in 7/17 and was admitted to [facility].  Cardiac cath showed no significant coronary disease.  Since that time, he has had no further chest pain.  SBP < 140 when he checks at home, good today.  He has only been taking amlodipine 5 mg daily rather than 2.5.   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  Biopsy showed intraductal papillary mucinous neoplasm.  He has been seen at [facility] and the plan has been serial followup by MRI for now.  ECG: NSR, normal  Labs (11/11): LDL 144, HDL 68, creatinine 1.2 Labs (4/12): LDL 106, HDL 68 Labs (8/14): LDL 71, HDL 54 Labs (7/17): K 4.1, creatinine 1.38, LDL 72  Allergies (verified):  No Known Drug Allergies  Past Medical History: 1. HTN 2. History of lightheadedness.  Workup for this about 1.5 years ago (Dr. Wynonia Lawman) included a 3 week event monitor and an echo, which were both unremarkable.  Of note, patient was no longer having lightheaded spells when he wore the monitor.  3.  Hyperlipidemia 4.  Colonic polyps 5.  Chest pain: ETT-myoview (3/12) with 9'30" exercise, no ischemic ECG changes, no evidence for ischemia or infarction by perfusion images, EF 64%.  - Coronary angiography 7/17 with no signficant CAD.  6.  GERD 7. COPD 8. 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  Followed at [facility].   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  He does help out there fairly frequently.  Smoked about 1 ppd but quit in 1984.  Married.  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   Current Outpatient Prescriptions  Medication Sig Dispense Refill  . ALPRAZolam (XANAX) 0.25 MG tablet Take 0.25 mg by mouth daily as needed. For anxiety    . amLODipine (NORVASC) 5 MG tablet Take 1 tablet (5 mg total) by mouth daily. 90 tablet 3  . aspirin EC 81 MG tablet Take 1 tablet (81 mg total) by mouth every evening. 90 tablet 3  . Calcium Carbonate-Vitamin D (CALCIUM + D PO) Take 1 tablet by mouth daily.    . Coenzyme Q10 ([employer]S COENZYME Q-10) 100 MG capsule Take 1 capsule by mouth daily.    Marland Kitchen lisinopril (PRINIVIL,ZESTRIL) 10 MG tablet Take 1 tablet (10 mg total) by mouth daily. 90 tablet 3  . loratadine (CLARITIN) 10 MG tablet Take 10 mg by mouth daily as needed for allergies.    . Multiple Vitamin (ONE-A-DAY 55 PLUS PO) Take 1 tablet by mouth at bedtime.    . rosuvastatin (CRESTOR) 10 MG tablet Take 1 tablet (10 mg total) by mouth daily. 90 tablet 3  . tamsulosin (FLOMAX) 0.4 MG CAPS capsule Take 0.4 mg by mouth at bedtime.    Marland Kitchen tiotropium (SPIRIVA HANDIHALER) 18 MCG inhalation capsule  INHALE THE CONTENTS OF 1  CAPSULE VIA HANDIHALER  DAILY. 90 capsule 3  . UNABLE TO FIND Cocoa extract: Take 2 capsules by mouth nightly     No current facility-administered medications for this visit.     BP 122/80   Pulse 80   Ht 5\' 10"  (1.778 m)   Wt 184 lb (83.5 kg)   BMI 26.40 kg/m  General:  Well developed, well nourished, in no acute distress. Overweight.  Neck:  Neck supple, no JVD. No masses, thyromegaly or abnormal cervical nodes. Lungs:  Clear bilaterally to auscultation and percussion. Heart:  Non-displaced PMI, chest non-tender; regular rate and rhythm, S1, S2 without murmurs, rubs or gallops. Carotid upstroke normal, no bruit.  Pedals normal pulses. No edema, no  varicosities. Abdomen:  Bowel sounds positive; abdomen soft and non-tender without masses, organomegaly, or hernias noted. No hepatosplenomegaly. Extremities:  No clubbing or cyanosis. Neurologic:  Alert and oriented x 3. Psych:  Normal affect.  Assessment/Plan: 1. Chest pain:  Coronary angiography in 7/17 with no significant CAD.   2. Hyperlipidemia:Good lipids in 7/17, continue Crestor.      3. HTN: BP has been under good control. He is only taking amlodipine 5 mg daily rather than 7.5, think he can stay with 5 mg daily.  4. COPD: Patient has been diagnosed by PFTs and has had improvement in dyspnea with Spiriva.  He may be moving to [place] permanently soon.  If he is still here, I will see him back in 1 year and heart/vascular clinic.   Loralie Champagne 03/03/2016

## 2016-03-13 ENCOUNTER — Ambulatory Visit: Payer: Medicare Other | Admitting: Cardiology

## 2016-03-18 DIAGNOSIS — Z23 Encounter for immunization: Secondary | ICD-10-CM | POA: Diagnosis not present

## 2016-03-31 DIAGNOSIS — M67912 Unspecified disorder of synovium and tendon, left shoulder: Secondary | ICD-10-CM | POA: Diagnosis not present

## 2016-04-17 DIAGNOSIS — M25512 Pain in left shoulder: Secondary | ICD-10-CM | POA: Diagnosis not present

## 2016-04-19 DIAGNOSIS — M67912 Unspecified disorder of synovium and tendon, left shoulder: Secondary | ICD-10-CM | POA: Diagnosis not present

## 2016-04-19 DIAGNOSIS — M25512 Pain in left shoulder: Secondary | ICD-10-CM | POA: Diagnosis not present

## 2016-04-24 DIAGNOSIS — M25512 Pain in left shoulder: Secondary | ICD-10-CM | POA: Diagnosis not present

## 2016-04-26 DIAGNOSIS — M25512 Pain in left shoulder: Secondary | ICD-10-CM | POA: Diagnosis not present

## 2016-05-01 DIAGNOSIS — M25512 Pain in left shoulder: Secondary | ICD-10-CM | POA: Diagnosis not present

## 2016-05-03 DIAGNOSIS — M25512 Pain in left shoulder: Secondary | ICD-10-CM | POA: Diagnosis not present

## 2016-05-08 DIAGNOSIS — M25512 Pain in left shoulder: Secondary | ICD-10-CM | POA: Diagnosis not present

## 2016-05-10 DIAGNOSIS — D490 Neoplasm of unspecified behavior of digestive system: Secondary | ICD-10-CM | POA: Diagnosis not present

## 2016-05-10 DIAGNOSIS — R978 Other abnormal tumor markers: Secondary | ICD-10-CM | POA: Diagnosis not present

## 2016-05-10 DIAGNOSIS — K862 Cyst of pancreas: Secondary | ICD-10-CM | POA: Diagnosis not present

## 2016-08-02 DIAGNOSIS — R8299 Other abnormal findings in urine: Secondary | ICD-10-CM | POA: Diagnosis not present

## 2016-08-02 DIAGNOSIS — Z125 Encounter for screening for malignant neoplasm of prostate: Secondary | ICD-10-CM | POA: Diagnosis not present

## 2016-08-02 DIAGNOSIS — I10 Essential (primary) hypertension: Secondary | ICD-10-CM | POA: Diagnosis not present

## 2016-08-02 DIAGNOSIS — E784 Other hyperlipidemia: Secondary | ICD-10-CM | POA: Diagnosis not present

## 2016-08-03 DIAGNOSIS — M859 Disorder of bone density and structure, unspecified: Secondary | ICD-10-CM | POA: Diagnosis not present

## 2016-08-11 DIAGNOSIS — R5383 Other fatigue: Secondary | ICD-10-CM | POA: Diagnosis not present

## 2016-08-11 DIAGNOSIS — M859 Disorder of bone density and structure, unspecified: Secondary | ICD-10-CM | POA: Diagnosis not present

## 2016-08-11 DIAGNOSIS — J449 Chronic obstructive pulmonary disease, unspecified: Secondary | ICD-10-CM | POA: Diagnosis not present

## 2016-08-11 DIAGNOSIS — I10 Essential (primary) hypertension: Secondary | ICD-10-CM | POA: Diagnosis not present

## 2016-08-11 DIAGNOSIS — Z1389 Encounter for screening for other disorder: Secondary | ICD-10-CM | POA: Diagnosis not present

## 2016-08-11 DIAGNOSIS — Z6828 Body mass index (BMI) 28.0-28.9, adult: Secondary | ICD-10-CM | POA: Diagnosis not present

## 2016-08-11 DIAGNOSIS — E784 Other hyperlipidemia: Secondary | ICD-10-CM | POA: Diagnosis not present

## 2016-08-11 DIAGNOSIS — Z0000 Encounter for general adult medical examination without abnormal findings: Secondary | ICD-10-CM | POA: Diagnosis not present

## 2016-08-11 DIAGNOSIS — N183 Chronic kidney disease, stage 3 (moderate): Secondary | ICD-10-CM | POA: Diagnosis not present

## 2016-08-14 DIAGNOSIS — Z1212 Encounter for screening for malignant neoplasm of rectum: Secondary | ICD-10-CM | POA: Diagnosis not present

## 2016-08-29 ENCOUNTER — Ambulatory Visit: Payer: Medicare Other | Admitting: Internal Medicine

## 2016-08-31 ENCOUNTER — Ambulatory Visit (INDEPENDENT_AMBULATORY_CARE_PROVIDER_SITE_OTHER): Payer: Medicare Other | Admitting: Internal Medicine

## 2016-08-31 ENCOUNTER — Encounter: Payer: Self-pay | Admitting: Internal Medicine

## 2016-08-31 DIAGNOSIS — J438 Other emphysema: Secondary | ICD-10-CM

## 2016-08-31 DIAGNOSIS — R682 Dry mouth, unspecified: Secondary | ICD-10-CM | POA: Diagnosis not present

## 2016-08-31 MED ORDER — TIOTROPIUM BROMIDE MONOHYDRATE 18 MCG IN CAPS: ORAL_CAPSULE | RESPIRATORY_TRACT | 3 refills | Status: DC

## 2016-08-31 NOTE — Assessment & Plan Note (Signed)
Stable  Plan Continue spiriva as scheduled; wil do 90 day supply to [facility] If spiriva is pricey, we can look for alternative Use albuterol as needed  followup 9 months or sooner if neede; at followup will decide on CT chest

## 2016-08-31 NOTE — Progress Notes (Signed)
Subjective:     Patient ID: Michael [name], male   DOB: [birth year], 76 y.o.   MRN: [id number]  HPI    #Thymoma stage I  - Diagnosed during low-dose CT scan of the chest screening program for lung cancer - Status post complete resection without residual disease September 03, 2012 - Followup Dr Servando Snare  #COPD mild, Gold stage I versus mild asthma - COPD cat score 4 on 10/08/2012  - Pulmonary function test done February 2014: Prebronchodilator FEV1 while on Spiriva 2.4 L/76%. Postbronchodilator FEV1 2.6 L/82% with FEV1 FEC ratio of 69 prebronchodilator and 76 post bronchodilator. Diffusion capacity 90%   #Pulmonary nodule - 3 mm lung nodule right upper lobe and left upper lobe diagnosed February 2014 during CT scan chest screening program for lung cancer   OV 10/08/2012  Followup for all of the above. He is now status post resection of thymoma which is now stage I. Looks like she will have annual surveillance CT scan of the chest with Dr. Servando Snare He is doing well. In retrospect he recollects retrosternal pain that is now completely resolved after surgery. He also does have some fatigue which is better. He denies any myasthenia symptoms. He continues with Spiriva and he finds good relief. I reviewed his pulmonary function test and is more suggestive of asthma because of normal diffusion capacity but he is content taking Spiriva and does not want to switch.  Currently he is putting his life back together and wants to focus on fitness by joining the [facility]  There are no new issues   OV 07/31/2013  Chief Complaint  Patient presents with  . Follow-up    Pt states he feels SOB is improved since last OV. Pt c/o having some PND, and dry mouth.     Followup for  - Emphysema mild: Currently stable. He is using Spiriva which he likes except for the fact that it might be associated with dry mouth [see below]. COPD cat score is 10 and reflects mild symptom burden.   - Lung nodule: He is due for  his serial CT scan of the chest in mid February 2015. This will mark 1 year followup  - Thymoma: Stage I. Status post resection. He is due for his one-year CT scan of the chest but February 2015 and this needs to be arranged  -- New complaint: Started Spiriva 8-9 months ago. However for the last 4 months even in the fall in the summer started noticing severe dry mouth especially at night. This is not associated with dry eyes. It is there daily. It is not progressive. It is relieved by drinking water. It is associated with Spiriva intake. His nose is her autoimmune symptoms or any other associated symptoms. This no radiation s    REC #Dry mouth   - stop spiriva for 3 weeks   #Mild emphysema  - currently stable  - due to above issue, stop spiriva.   - use albuterol 2 puff as needed; take script 0- if you get worse, call us  #Lung nodule and thymoma followup  - do CT scan chest with contrast mid feb 2015  #Followup  3weeks to assess followup of above issues  OV 08/22/2013  Chief Complaint  Patient presents with  . Follow-up    to review CT results. Pt states cough and sob has improved with spiriva.     Fu several issues  - dry mouth: did not improve after dc spiriva. So went back on spiriva.  Advised biotene mouth wash and he is better. He is due to see dentist.   - emphysema: got worse after he stopped spiriva but improved and back to baseline after he went back on spiriva  - lung nodule and thymoma; had 1 year CT chest that shows no recurence and nodule stability  - New issue: CT chest feb 2015 shows possible mass lesion tail of pancreas and more advnaced imaging recommended. He prefers I d/w Dr Henrene Pastor his GI first before ordering advanced imaging   Ct Chest Wo Contrast  08/20/2013   CLINICAL DATA:  Routine followup lung nodule  EXAM: CT CHEST WITHOUT CONTRAST  TECHNIQUE: Multidetector CT imaging of the chest was performed following the standard protocol without IV contrast.   COMPARISON:  08/19/2012  FINDINGS: No pleural effusion identified. There is no airspace consolidation. Scarring noted within the posterior lung bases. Scar is identified within the left upper lobe, image 19/series 3 stable tiny left upper lobe nodule measuring 3 mm, image 18/series 3. Right upper lobe nodule measures 3 mm and is unchanged from previous exam, image 22/series 3. No new or enlarging pulmonary nodules or masses. 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 No evidence for recurrence of tumor. Calcified atherosclerotic change involves the thoracic aorta. There is no aneurysm. There is also a calcification within the LAD coronary artery. No mediastinal or hilar adenopathy noted. There is no axillary or supraclavicular adenopathy.  Incidental imaging through the upper abdomen shows no acute findings. However, there is a change in the appearance of the distal tail of pancreas. An area of soft tissue fullness and mild peripheral and distinctness measures 2.5 cm, image 57/series 2.  IMPRESSION: 1. Stable small pulmonary nodules which are likely benign. 2. Status post resection of anterior mediastinal mass without evidence for recurrence of tumor. 3. Interval change in the appearance of the distal tail of pancreas which now exhibits abnormal soft tissue fullness and mild peripheral indistinctness. Underlying pancreatic mass cannot be excluded on this noncontrast CT through the upper abdomen. Recommend further evaluation with contrast enhanced CT or MRI of the pancreas. These results will be called to the ordering clinician or representative by the Radiologist Assistant, and communication documented in the PACS Dashboard. .   Electronically Signed   By: Kerby Moors M.D.   On: 08/20/2013 15:59    OV 06/05/2014  Chief Complaint  Patient presents with  . Follow-up    Pt c/o of recent chest cold but resolved with abx that was given for a dental procedure. Pt denies change in SOB and c/o  prod cough with clear mucus. Pt denies CP/tightness.      Fu several issues  - dry mouth: did not improve after dc spiriva. So went back on spiriva.Still persists, mild. Has learned to live wit hit  - emphysema: got worse after he stopped spiriva but improved and back to baseline after he went back on spiriva. He says uptodate with vaccines. Wants 1 year refill on spiriva starting jan 2016  - Pst nasal drip: mild but more active in winter  - lung nodule and thymoma; had 1 year CT chest that shows no recurence and nodule stability in feb 2015. 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 He is now fu at [facility] with Q6 month MRI. Not malignant apparently  OV 03/03/2015  Chief Complaint  Patient presents with  . Follow-up  Pt states his breathing is unchanged since last OV. Pt c/o PND and mild intermittent cough with little mucus production. Pt significant SOB, CP/tightness.    Follow-up several issues  - Emphysema: He continues on Spiriva 1 puff daily. Without this his symptoms has gotten worse in the past. He's had his flu shot. COPD cat score is less than 10 and shows stable and minimal symptom burden  Postnasal drip: Nasal steroidal Ciclesonide helped him a little bit but then he stopped using it. He wants a cheaper alternative. I gave him samples of Ciclesonide. He will get generic fluticasone  - Thymoma: Last CT scan of the chest was interpreted 2016 and showed this was a benign thymoma in complete remission. Dr. Pia Mau the surgeon is no longer following him. I will check with Dr. Pia Mau as to when the next CT scan should be if at all  - Dry mouth: This continues to plague him. He is frustrated but has learned to live with it. Of note is on ACE inhibitor which is not known to cause dry mouth    OV 08/31/2016  Chief Complaint  Patient presents with  . Follow-up    Pt states his breathing  is unchanged since last OV. Pt states he has an occ prod cough with clear mucus. Pt denies CP/tightness and f/c/s.    Follow-up emphysema: He continues on Spiriva 1 puff daily. Overall he is stable and doing well. He plans to relocate to [place]. 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 In terms of his thymoma he had a CT scan in summer 2017 without any recurrence. He continues to have dry mouth. There are no other acute issues. He is up-to-date with flu shot    CAT COPD Symptom & Quality of Life Score ([place]) 0 is no burden. 5 is highest burden 03/03/2015   Never Cough -> Cough all the time 1  No phlegm in chest -> Chest is full of phlegm 1  No chest tightness -> Chest feels very tight 1  No dyspnea for 1 flight stairs/hill -> Very dyspneic for 1 flight of stairs 2  No limitations for ADL at home -> Very limited with ADL at home 1  2Confident leaving home -> Not at all confident leaving home 1  2Sleep soundly -> Do not sleep soundly because of lung condition 2  Lots of Energy -> No energy at all 2  TOTAL Score (max 40)  10      has a past medical history of CAD (coronary artery disease); CKD (chronic kidney disease); COPD (chronic obstructive pulmonary disease) ([place]); Diverticulosis; Emphysema of lung ([place]); Essential hypertension, benign; GERD (gastroesophageal reflux disease); History of colonic polyps; Hyperlipidemia; and Lightheadedness.   reports that he quit smoking about 34 years ago. His smoking use included Cigarettes. He has a 25.00 pack-year smoking history. He has never used smokeless tobacco.  Past Surgical History:  Procedure Laterality Date  . CARDIAC CATHETERIZATION N/A 01/25/2016   Procedure: Left Heart Cath and Coronary Angiography;  Surgeon: Troy Sine, MD;  Location: [facility];  Service: Cardiovascular;  Laterality: N/A;  . CATARACT EXTRACTION    . COLONOSCOPY    . EUS N/A 09/18/2013   Procedure: UPPER ENDOSCOPIC ULTRASOUND  (EUS) LINEAR;  Surgeon: [name], MD;  Location: [facility];  Service: Endoscopy;  Laterality: N/A;  radial linear  . EYE SURGERY Right    muscle   . FLEXIBLE BRONCHOSCOPY N/A 09/03/2012   Procedure:  FLEXIBLE BRONCHOSCOPY;  Surgeon: Grace Isaac, MD;  Location: [facility];  Service: Thoracic;  Laterality: N/A;  . POLYPECTOMY    . VIDEO ASSISTED THORACOSCOPY (VATS)/WEDGE RESECTION Left 09/03/2012   Procedure: VIDEO ASSISTED THORACOSCOPY (VATS) ,Resection Anterior Mediastinal Mass;  Surgeon: Grace Isaac, MD;  Location: [facility];  Service: Thoracic;  Laterality: Left;    No Known Allergies  Immunization History  Administered Date(s) Administered  . DT 02/09/2009  . Influenza Split 05/03/2012, 05/03/2013, 05/03/2014, 03/02/2015  . Influenza, High Dose Seasonal PF 04/02/2016  . Pneumococcal Conjugate-13 06/16/2013  . Pneumococcal Polysaccharide-23 07/03/2010  . Zoster 09/08/2010    Family History  Problem Relation Age of Onset  . Emphysema Father   . Cancer Mother   . Coronary artery disease Neg Hx   . Colon cancer Neg Hx   . Esophageal cancer Neg Hx   . Rectal cancer Neg Hx   . Stomach cancer Neg Hx      Current Outpatient Prescriptions:  .  ALPRAZolam (XANAX) 0.25 MG tablet, Take 0.25 mg by mouth 2 (two) times daily as needed. For anxiety, Disp: , Rfl:  .  amLODipine (NORVASC) 5 MG tablet, Take 1 tablet (5 mg total) by mouth daily., Disp: 90 tablet, Rfl: 3 .  aspirin EC 81 MG tablet, Take 1 tablet (81 mg total) by mouth every evening., Disp: 90 tablet, Rfl: 3 .  Calcium Carbonate-Vitamin D (CALCIUM + D PO), Take 1 tablet by mouth daily., Disp: , Rfl:  .  Coenzyme Q10 ([employer] COENZYME Q-10) 100 MG capsule, Take 1 capsule by mouth daily., Disp: , Rfl:  .  lisinopril (PRINIVIL,ZESTRIL) 10 MG tablet, Take 1 tablet (10 mg total) by mouth daily., Disp: 90 tablet, Rfl: 3 .  loratadine (CLARITIN) 10 MG tablet, Take 10 mg by mouth daily as needed for allergies., Disp: , Rfl:  .   Multiple Vitamin (ONE-A-DAY 55 PLUS PO), Take 1 tablet by mouth at bedtime., Disp: , Rfl:  .  rosuvastatin (CRESTOR) 10 MG tablet, Take 1 tablet (10 mg total) by mouth daily., Disp: 90 tablet, Rfl: 3 .  tiotropium (SPIRIVA HANDIHALER) 18 MCG inhalation capsule, INHALE THE CONTENTS OF 1  CAPSULE VIA HANDIHALER  DAILY., Disp: 90 capsule, Rfl: 3 .  UNABLE TO FIND, Cocoa extract: Take 2 capsules by mouth nightly, Disp: , Rfl:  .  tamsulosin (FLOMAX) 0.4 MG CAPS capsule, Take 0.4 mg by mouth at bedtime., Disp: , Rfl:     Review of Systems     Objective:   Physical Exam  Constitutional: He is oriented to person, place, and time. He appears well-developed and well-nourished. No distress.  HENT:  Head: Normocephalic and atraumatic.  Right Ear: External ear normal.  Left Ear: External ear normal.  Mouth/Throat: Oropharynx is clear and moist. No oropharyngeal exudate.  Eyes: Conjunctivae and EOM are normal. Pupils are equal, round, and reactive to light. Right eye exhibits no discharge. Left eye exhibits no discharge. No scleral icterus.  Neck: Normal range of motion. Neck supple. No JVD present. No tracheal deviation present. No thyromegaly present.  Cardiovascular: Normal rate, regular rhythm and intact distal pulses.  Exam reveals no gallop and no friction rub.   No murmur heard. Pulmonary/Chest: Effort normal and breath sounds normal. No respiratory distress. He has no wheezes. He has no rales. He exhibits no tenderness.  Abdominal: Soft. Bowel sounds are normal. He exhibits no distension and no mass. There is no tenderness. There is no rebound and no guarding.  Musculoskeletal: Normal range of motion. He exhibits no edema or tenderness.  Lymphadenopathy:    He has no cervical adenopathy.  Neurological: He is alert and oriented to person, place, and time. He has normal reflexes. No cranial nerve deficit. Coordination normal.  Skin: Skin is warm and dry. No rash noted. He is not diaphoretic. No  erythema. No pallor.  Psychiatric: He has a normal mood and affect. His behavior is normal. Judgment and thought content normal.  Nursing note and vitals reviewed.  Vitals:   08/31/16 1226  BP: (!) 144/80  Pulse: 74  SpO2: 97%  Weight: 192 lb (87.1 kg)  Height: 5\' 10"  (1.778 m)    Estimated body mass index is 27.55 kg/m as calculated from the following:   Height as of this encounter: 5\' 10"  (1.778 m).   Weight as of this encounter: 192 lb (87.1 kg).      Assessment:       ICD-9-CM ICD-10-CM   1. Other emphysema (HCC) 492.8 J43.8        Plan:     Other emphysema Stable  Plan Continue spiriva as scheduled; wil [facility] If spiriva is pricey, we can look for alternative Use albuterol as needed  followup 9 months or sooner if neede; at followup will decide on CT chest    Dr. Brand Males, M.D., [facility].C.P Pulmonary and Critical Care Medicine Staff Physician [place] Pulmonary and Critical Care Pager: [phone], If no answer or between  15:00h - 7:00h: call [phone]  08/31/2016 12:55 PM

## 2016-08-31 NOTE — Patient Instructions (Signed)
Other emphysema Stable  Plan Continue spiriva as scheduled; wil [facility] supply to [facility] If spiriva is pricey, we can look for alternative Use albuterol as needed  followup 9 months or sooner if neede; at followup will decide on CT chest

## 2016-10-20 ENCOUNTER — Telehealth: Payer: Self-pay | Admitting: Internal Medicine

## 2016-10-20 MED ORDER — AZITHROMYCIN 250 MG PO TABS: ORAL_TABLET | ORAL | 0 refills | Status: AC

## 2016-10-20 NOTE — Telephone Encounter (Signed)
Pt c/o prod cough with green mucus, sinus congestion, pnd, runny eyes, headache X1 day.   Denies fever, chest pain.    Pt has taken claritin, flonase, advil cold and sinus to help with symptoms.  Requesting further recs.    Pt is currently in [place]- requesting any meds to be sent to below verified pharmacy.  MR please advise on recs.  Thanks!

## 2016-10-20 NOTE — Telephone Encounter (Signed)
Z pak and if this is not kicking things out he should do Please take prednisone 40 mg x1 day, then 30 mg x1 day, then 20 mg x1 day, then 10 mg x1 day, and then 5 mg x1 day and stop,   Howeve, if he wants he can do both now itself  Dr. Brand Males, M.D., [facility].C.P Pulmonary and Critical Care Medicine Staff Physician [place] Pulmonary and Critical Care Pager: [phone], If no answer or between  15:00h - 7:00h: call [phone]  10/20/2016 11:16 AM

## 2016-10-20 NOTE — Telephone Encounter (Signed)
Called and spoke with pt and he stated that the last time that this happened he only used the zpak.  Pt only wanted the [facility]ak sent to the [facility] in [place] and this has been done.  He stated that if he needs the prednisone he will call back and get this sent in.

## 2016-12-18 ENCOUNTER — Other Ambulatory Visit: Payer: Self-pay | Admitting: Internal Medicine

## 2017-03-25 DIAGNOSIS — I10 Essential (primary) hypertension: Secondary | ICD-10-CM | POA: Diagnosis not present

## 2017-03-25 DIAGNOSIS — Z79899 Other long term (current) drug therapy: Secondary | ICD-10-CM | POA: Diagnosis not present

## 2017-03-25 DIAGNOSIS — Z743 Need for continuous supervision: Secondary | ICD-10-CM | POA: Diagnosis not present

## 2017-03-25 DIAGNOSIS — Z87891 Personal history of nicotine dependence: Secondary | ICD-10-CM | POA: Diagnosis not present

## 2017-03-25 DIAGNOSIS — E785 Hyperlipidemia, unspecified: Secondary | ICD-10-CM | POA: Diagnosis not present

## 2017-03-25 DIAGNOSIS — Z7982 Long term (current) use of aspirin: Secondary | ICD-10-CM | POA: Diagnosis not present

## 2017-03-25 DIAGNOSIS — R0789 Other chest pain: Secondary | ICD-10-CM | POA: Diagnosis not present

## 2017-03-25 DIAGNOSIS — R072 Precordial pain: Secondary | ICD-10-CM | POA: Diagnosis not present

## 2017-05-09 DIAGNOSIS — D490 Neoplasm of unspecified behavior of digestive system: Secondary | ICD-10-CM | POA: Diagnosis not present

## 2017-05-09 DIAGNOSIS — K862 Cyst of pancreas: Secondary | ICD-10-CM | POA: Diagnosis not present

## 2017-05-09 DIAGNOSIS — Z87891 Personal history of nicotine dependence: Secondary | ICD-10-CM | POA: Diagnosis not present

## 2017-05-18 DIAGNOSIS — C259 Malignant neoplasm of pancreas, unspecified: Secondary | ICD-10-CM | POA: Diagnosis not present

## 2017-05-18 DIAGNOSIS — C252 Malignant neoplasm of tail of pancreas: Secondary | ICD-10-CM | POA: Diagnosis not present

## 2017-05-18 DIAGNOSIS — I899 Noninfective disorder of lymphatic vessels and lymph nodes, unspecified: Secondary | ICD-10-CM | POA: Diagnosis not present

## 2017-05-18 DIAGNOSIS — J449 Chronic obstructive pulmonary disease, unspecified: Secondary | ICD-10-CM | POA: Diagnosis not present

## 2017-05-18 DIAGNOSIS — C250 Malignant neoplasm of head of pancreas: Secondary | ICD-10-CM | POA: Diagnosis not present

## 2017-05-18 DIAGNOSIS — K862 Cyst of pancreas: Secondary | ICD-10-CM | POA: Diagnosis not present

## 2017-05-18 DIAGNOSIS — Z79899 Other long term (current) drug therapy: Secondary | ICD-10-CM | POA: Diagnosis not present

## 2017-05-18 DIAGNOSIS — I10 Essential (primary) hypertension: Secondary | ICD-10-CM | POA: Diagnosis not present

## 2017-05-18 DIAGNOSIS — Z87891 Personal history of nicotine dependence: Secondary | ICD-10-CM | POA: Diagnosis not present

## 2017-06-08 DIAGNOSIS — C257 Malignant neoplasm of other parts of pancreas: Secondary | ICD-10-CM | POA: Diagnosis not present

## 2017-06-08 DIAGNOSIS — D490 Neoplasm of unspecified behavior of digestive system: Secondary | ICD-10-CM | POA: Diagnosis not present

## 2017-06-08 DIAGNOSIS — R918 Other nonspecific abnormal finding of lung field: Secondary | ICD-10-CM | POA: Diagnosis not present

## 2017-06-08 DIAGNOSIS — D150 Benign neoplasm of thymus: Secondary | ICD-10-CM | POA: Diagnosis not present

## 2017-06-08 DIAGNOSIS — C801 Malignant (primary) neoplasm, unspecified: Secondary | ICD-10-CM | POA: Diagnosis not present

## 2017-06-11 DIAGNOSIS — F418 Other specified anxiety disorders: Secondary | ICD-10-CM | POA: Diagnosis not present

## 2017-06-11 DIAGNOSIS — C252 Malignant neoplasm of tail of pancreas: Secondary | ICD-10-CM | POA: Diagnosis not present

## 2017-06-11 DIAGNOSIS — I10 Essential (primary) hypertension: Secondary | ICD-10-CM | POA: Diagnosis not present

## 2017-06-11 DIAGNOSIS — Z5331 Laparoscopic surgical procedure converted to open procedure: Secondary | ICD-10-CM | POA: Diagnosis not present

## 2017-06-11 DIAGNOSIS — R918 Other nonspecific abnormal finding of lung field: Secondary | ICD-10-CM | POA: Diagnosis present

## 2017-06-11 DIAGNOSIS — G8918 Other acute postprocedural pain: Secondary | ICD-10-CM | POA: Diagnosis not present

## 2017-06-11 DIAGNOSIS — D490 Neoplasm of unspecified behavior of digestive system: Secondary | ICD-10-CM | POA: Diagnosis not present

## 2017-06-11 DIAGNOSIS — R140 Abdominal distension (gaseous): Secondary | ICD-10-CM | POA: Diagnosis not present

## 2017-06-11 DIAGNOSIS — K429 Umbilical hernia without obstruction or gangrene: Secondary | ICD-10-CM | POA: Diagnosis not present

## 2017-06-11 DIAGNOSIS — Z87891 Personal history of nicotine dependence: Secondary | ICD-10-CM | POA: Diagnosis not present

## 2017-06-11 DIAGNOSIS — K9161 Intraoperative hemorrhage and hematoma of a digestive system organ or structure complicating a digestive sytem procedure: Secondary | ICD-10-CM | POA: Diagnosis not present

## 2017-06-11 DIAGNOSIS — J449 Chronic obstructive pulmonary disease, unspecified: Secondary | ICD-10-CM | POA: Diagnosis not present

## 2017-06-11 DIAGNOSIS — L299 Pruritus, unspecified: Secondary | ICD-10-CM | POA: Diagnosis not present

## 2017-06-11 DIAGNOSIS — R911 Solitary pulmonary nodule: Secondary | ICD-10-CM | POA: Diagnosis present

## 2017-06-11 DIAGNOSIS — I9581 Postprocedural hypotension: Secondary | ICD-10-CM | POA: Diagnosis not present

## 2017-06-11 DIAGNOSIS — C259 Malignant neoplasm of pancreas, unspecified: Secondary | ICD-10-CM | POA: Diagnosis not present

## 2017-06-11 DIAGNOSIS — R109 Unspecified abdominal pain: Secondary | ICD-10-CM | POA: Diagnosis not present

## 2017-06-20 DIAGNOSIS — D490 Neoplasm of unspecified behavior of digestive system: Secondary | ICD-10-CM | POA: Diagnosis not present

## 2017-06-20 DIAGNOSIS — C252 Malignant neoplasm of tail of pancreas: Secondary | ICD-10-CM | POA: Diagnosis not present

## 2017-06-20 DIAGNOSIS — C801 Malignant (primary) neoplasm, unspecified: Secondary | ICD-10-CM | POA: Diagnosis not present

## 2017-06-20 DIAGNOSIS — C257 Malignant neoplasm of other parts of pancreas: Secondary | ICD-10-CM | POA: Diagnosis not present

## 2017-06-22 DIAGNOSIS — C252 Malignant neoplasm of tail of pancreas: Secondary | ICD-10-CM | POA: Diagnosis not present

## 2017-06-22 DIAGNOSIS — C259 Malignant neoplasm of pancreas, unspecified: Secondary | ICD-10-CM | POA: Diagnosis not present

## 2017-07-13 DIAGNOSIS — Z6825 Body mass index (BMI) 25.0-25.9, adult: Secondary | ICD-10-CM | POA: Diagnosis not present

## 2017-07-13 DIAGNOSIS — C252 Malignant neoplasm of tail of pancreas: Secondary | ICD-10-CM | POA: Diagnosis not present

## 2017-07-13 DIAGNOSIS — C259 Malignant neoplasm of pancreas, unspecified: Secondary | ICD-10-CM | POA: Diagnosis not present

## 2017-07-27 DIAGNOSIS — C252 Malignant neoplasm of tail of pancreas: Secondary | ICD-10-CM | POA: Diagnosis not present

## 2017-07-27 DIAGNOSIS — Z452 Encounter for adjustment and management of vascular access device: Secondary | ICD-10-CM | POA: Diagnosis not present

## 2017-08-01 DIAGNOSIS — C252 Malignant neoplasm of tail of pancreas: Secondary | ICD-10-CM | POA: Diagnosis not present

## 2017-08-01 DIAGNOSIS — Z5111 Encounter for antineoplastic chemotherapy: Secondary | ICD-10-CM | POA: Diagnosis not present

## 2017-08-01 DIAGNOSIS — Z6826 Body mass index (BMI) 26.0-26.9, adult: Secondary | ICD-10-CM | POA: Diagnosis not present

## 2017-08-03 DIAGNOSIS — Z452 Encounter for adjustment and management of vascular access device: Secondary | ICD-10-CM | POA: Diagnosis not present

## 2017-08-06 DIAGNOSIS — R935 Abnormal findings on diagnostic imaging of other abdominal regions, including retroperitoneum: Secondary | ICD-10-CM | POA: Diagnosis not present

## 2017-08-06 DIAGNOSIS — I10 Essential (primary) hypertension: Secondary | ICD-10-CM | POA: Diagnosis present

## 2017-08-06 DIAGNOSIS — T451X5A Adverse effect of antineoplastic and immunosuppressive drugs, initial encounter: Secondary | ICD-10-CM | POA: Diagnosis present

## 2017-08-06 DIAGNOSIS — D72829 Elevated white blood cell count, unspecified: Secondary | ICD-10-CM | POA: Diagnosis not present

## 2017-08-06 DIAGNOSIS — K521 Toxic gastroenteritis and colitis: Secondary | ICD-10-CM | POA: Diagnosis not present

## 2017-08-06 DIAGNOSIS — Z87891 Personal history of nicotine dependence: Secondary | ICD-10-CM | POA: Diagnosis not present

## 2017-08-06 DIAGNOSIS — Q8901 Asplenia (congenital): Secondary | ICD-10-CM | POA: Diagnosis not present

## 2017-08-06 DIAGNOSIS — E785 Hyperlipidemia, unspecified: Secondary | ICD-10-CM | POA: Diagnosis present

## 2017-08-06 DIAGNOSIS — K859 Acute pancreatitis without necrosis or infection, unspecified: Secondary | ICD-10-CM | POA: Diagnosis not present

## 2017-08-06 DIAGNOSIS — C259 Malignant neoplasm of pancreas, unspecified: Secondary | ICD-10-CM | POA: Diagnosis present

## 2017-08-06 DIAGNOSIS — N281 Cyst of kidney, acquired: Secondary | ICD-10-CM | POA: Diagnosis not present

## 2017-08-06 DIAGNOSIS — K529 Noninfective gastroenteritis and colitis, unspecified: Secondary | ICD-10-CM | POA: Diagnosis not present

## 2017-08-06 DIAGNOSIS — E440 Moderate protein-calorie malnutrition: Secondary | ICD-10-CM | POA: Diagnosis not present

## 2017-08-06 DIAGNOSIS — K9189 Other postprocedural complications and disorders of digestive system: Secondary | ICD-10-CM | POA: Diagnosis not present

## 2017-08-06 DIAGNOSIS — C252 Malignant neoplasm of tail of pancreas: Secondary | ICD-10-CM | POA: Diagnosis not present

## 2017-08-06 DIAGNOSIS — K5730 Diverticulosis of large intestine without perforation or abscess without bleeding: Secondary | ICD-10-CM | POA: Diagnosis not present

## 2017-08-06 DIAGNOSIS — K861 Other chronic pancreatitis: Secondary | ICD-10-CM | POA: Diagnosis not present

## 2017-08-06 DIAGNOSIS — R109 Unspecified abdominal pain: Secondary | ICD-10-CM | POA: Diagnosis not present

## 2017-08-06 DIAGNOSIS — R1012 Left upper quadrant pain: Secondary | ICD-10-CM | POA: Diagnosis not present

## 2017-08-06 DIAGNOSIS — R1013 Epigastric pain: Secondary | ICD-10-CM | POA: Diagnosis not present

## 2017-08-06 DIAGNOSIS — J449 Chronic obstructive pulmonary disease, unspecified: Secondary | ICD-10-CM | POA: Diagnosis present

## 2017-08-06 DIAGNOSIS — Z6825 Body mass index (BMI) 25.0-25.9, adult: Secondary | ICD-10-CM | POA: Diagnosis not present

## 2017-08-15 DIAGNOSIS — C259 Malignant neoplasm of pancreas, unspecified: Secondary | ICD-10-CM | POA: Diagnosis not present

## 2017-08-15 DIAGNOSIS — Z6825 Body mass index (BMI) 25.0-25.9, adult: Secondary | ICD-10-CM | POA: Diagnosis not present

## 2017-08-15 DIAGNOSIS — C252 Malignant neoplasm of tail of pancreas: Secondary | ICD-10-CM | POA: Diagnosis not present

## 2017-08-16 DIAGNOSIS — C259 Malignant neoplasm of pancreas, unspecified: Secondary | ICD-10-CM | POA: Diagnosis not present

## 2017-08-16 DIAGNOSIS — Z5111 Encounter for antineoplastic chemotherapy: Secondary | ICD-10-CM | POA: Diagnosis not present

## 2017-08-16 DIAGNOSIS — C252 Malignant neoplasm of tail of pancreas: Secondary | ICD-10-CM | POA: Diagnosis not present

## 2017-08-22 DIAGNOSIS — Z6824 Body mass index (BMI) 24.0-24.9, adult: Secondary | ICD-10-CM | POA: Diagnosis not present

## 2017-08-22 DIAGNOSIS — Z452 Encounter for adjustment and management of vascular access device: Secondary | ICD-10-CM | POA: Diagnosis not present

## 2017-08-22 DIAGNOSIS — R1012 Left upper quadrant pain: Secondary | ICD-10-CM | POA: Diagnosis not present

## 2017-08-22 DIAGNOSIS — R7989 Other specified abnormal findings of blood chemistry: Secondary | ICD-10-CM | POA: Diagnosis not present

## 2017-08-22 DIAGNOSIS — Z5111 Encounter for antineoplastic chemotherapy: Secondary | ICD-10-CM | POA: Diagnosis not present

## 2017-08-22 DIAGNOSIS — C259 Malignant neoplasm of pancreas, unspecified: Secondary | ICD-10-CM | POA: Diagnosis not present

## 2017-08-22 DIAGNOSIS — C252 Malignant neoplasm of tail of pancreas: Secondary | ICD-10-CM | POA: Diagnosis not present

## 2017-08-24 DIAGNOSIS — Z452 Encounter for adjustment and management of vascular access device: Secondary | ICD-10-CM | POA: Diagnosis not present

## 2017-08-25 DIAGNOSIS — K5730 Diverticulosis of large intestine without perforation or abscess without bleeding: Secondary | ICD-10-CM | POA: Diagnosis not present

## 2017-08-25 DIAGNOSIS — I10 Essential (primary) hypertension: Secondary | ICD-10-CM | POA: Diagnosis not present

## 2017-08-25 DIAGNOSIS — K8689 Other specified diseases of pancreas: Secondary | ICD-10-CM | POA: Diagnosis not present

## 2017-08-25 DIAGNOSIS — R739 Hyperglycemia, unspecified: Secondary | ICD-10-CM | POA: Diagnosis present

## 2017-08-25 DIAGNOSIS — C252 Malignant neoplasm of tail of pancreas: Secondary | ICD-10-CM | POA: Diagnosis not present

## 2017-08-25 DIAGNOSIS — Z79899 Other long term (current) drug therapy: Secondary | ICD-10-CM | POA: Diagnosis not present

## 2017-08-25 DIAGNOSIS — K858 Other acute pancreatitis without necrosis or infection: Secondary | ICD-10-CM | POA: Diagnosis present

## 2017-08-25 DIAGNOSIS — K5900 Constipation, unspecified: Secondary | ICD-10-CM | POA: Diagnosis present

## 2017-08-25 DIAGNOSIS — N390 Urinary tract infection, site not specified: Secondary | ICD-10-CM | POA: Diagnosis not present

## 2017-08-25 DIAGNOSIS — D72829 Elevated white blood cell count, unspecified: Secondary | ICD-10-CM | POA: Diagnosis not present

## 2017-08-25 DIAGNOSIS — J449 Chronic obstructive pulmonary disease, unspecified: Secondary | ICD-10-CM | POA: Diagnosis present

## 2017-08-25 DIAGNOSIS — K859 Acute pancreatitis without necrosis or infection, unspecified: Secondary | ICD-10-CM | POA: Diagnosis not present

## 2017-08-25 DIAGNOSIS — C259 Malignant neoplasm of pancreas, unspecified: Secondary | ICD-10-CM | POA: Diagnosis not present

## 2017-08-25 DIAGNOSIS — E871 Hypo-osmolality and hyponatremia: Secondary | ICD-10-CM | POA: Diagnosis not present

## 2017-08-25 DIAGNOSIS — N179 Acute kidney failure, unspecified: Secondary | ICD-10-CM | POA: Diagnosis not present

## 2017-08-25 DIAGNOSIS — R109 Unspecified abdominal pain: Secondary | ICD-10-CM | POA: Diagnosis not present

## 2017-08-25 DIAGNOSIS — E785 Hyperlipidemia, unspecified: Secondary | ICD-10-CM | POA: Diagnosis not present

## 2017-08-25 DIAGNOSIS — T8143XA Infection following a procedure, organ and space surgical site, initial encounter: Secondary | ICD-10-CM | POA: Diagnosis not present

## 2017-08-25 DIAGNOSIS — R1012 Left upper quadrant pain: Secondary | ICD-10-CM | POA: Diagnosis not present

## 2017-09-05 DIAGNOSIS — Z452 Encounter for adjustment and management of vascular access device: Secondary | ICD-10-CM | POA: Diagnosis not present

## 2017-09-05 DIAGNOSIS — Z5111 Encounter for antineoplastic chemotherapy: Secondary | ICD-10-CM | POA: Diagnosis not present

## 2017-09-05 DIAGNOSIS — R7989 Other specified abnormal findings of blood chemistry: Secondary | ICD-10-CM | POA: Diagnosis not present

## 2017-09-05 DIAGNOSIS — Z6823 Body mass index (BMI) 23.0-23.9, adult: Secondary | ICD-10-CM | POA: Diagnosis not present

## 2017-09-05 DIAGNOSIS — C252 Malignant neoplasm of tail of pancreas: Secondary | ICD-10-CM | POA: Diagnosis not present

## 2017-09-07 DIAGNOSIS — Z452 Encounter for adjustment and management of vascular access device: Secondary | ICD-10-CM | POA: Diagnosis not present

## 2017-09-18 DIAGNOSIS — C252 Malignant neoplasm of tail of pancreas: Secondary | ICD-10-CM | POA: Diagnosis not present

## 2017-09-19 DIAGNOSIS — Z5111 Encounter for antineoplastic chemotherapy: Secondary | ICD-10-CM | POA: Diagnosis not present

## 2017-09-19 DIAGNOSIS — C252 Malignant neoplasm of tail of pancreas: Secondary | ICD-10-CM | POA: Diagnosis not present

## 2017-09-19 DIAGNOSIS — R1012 Left upper quadrant pain: Secondary | ICD-10-CM | POA: Diagnosis not present

## 2017-09-21 DIAGNOSIS — Z452 Encounter for adjustment and management of vascular access device: Secondary | ICD-10-CM | POA: Diagnosis not present

## 2017-10-03 DIAGNOSIS — C252 Malignant neoplasm of tail of pancreas: Secondary | ICD-10-CM | POA: Diagnosis not present

## 2017-10-03 DIAGNOSIS — Z5111 Encounter for antineoplastic chemotherapy: Secondary | ICD-10-CM | POA: Diagnosis not present

## 2017-10-03 DIAGNOSIS — T82868A Thrombosis of vascular prosthetic devices, implants and grafts, initial encounter: Secondary | ICD-10-CM | POA: Diagnosis not present

## 2017-10-03 DIAGNOSIS — K8689 Other specified diseases of pancreas: Secondary | ICD-10-CM | POA: Diagnosis not present

## 2017-10-03 DIAGNOSIS — Z6823 Body mass index (BMI) 23.0-23.9, adult: Secondary | ICD-10-CM | POA: Diagnosis not present

## 2017-10-03 DIAGNOSIS — Z452 Encounter for adjustment and management of vascular access device: Secondary | ICD-10-CM | POA: Diagnosis not present

## 2017-10-05 DIAGNOSIS — Z452 Encounter for adjustment and management of vascular access device: Secondary | ICD-10-CM | POA: Diagnosis not present

## 2017-10-12 DIAGNOSIS — I4892 Unspecified atrial flutter: Secondary | ICD-10-CM | POA: Diagnosis not present

## 2017-10-12 DIAGNOSIS — I10 Essential (primary) hypertension: Secondary | ICD-10-CM | POA: Diagnosis not present

## 2017-10-12 DIAGNOSIS — A419 Sepsis, unspecified organism: Secondary | ICD-10-CM | POA: Diagnosis not present

## 2017-10-12 DIAGNOSIS — Z87891 Personal history of nicotine dependence: Secondary | ICD-10-CM | POA: Diagnosis not present

## 2017-10-12 DIAGNOSIS — R000 Tachycardia, unspecified: Secondary | ICD-10-CM | POA: Diagnosis not present

## 2017-10-12 DIAGNOSIS — N390 Urinary tract infection, site not specified: Secondary | ICD-10-CM | POA: Diagnosis not present
